# Patient Record
Sex: Male | Born: 1950
Health system: Southern US, Community
[De-identification: ages and names within clinical notes are randomized; demographics above are authoritative.]

## PROBLEM LIST (undated history)

## (undated) DIAGNOSIS — B029 Zoster without complications: Secondary | ICD-10-CM

## (undated) DIAGNOSIS — I10 Essential (primary) hypertension: Secondary | ICD-10-CM

## (undated) DIAGNOSIS — Z8551 Personal history of malignant neoplasm of bladder: Secondary | ICD-10-CM

## (undated) DIAGNOSIS — B269 Mumps without complication: Secondary | ICD-10-CM

## (undated) DIAGNOSIS — B019 Varicella without complication: Secondary | ICD-10-CM

## (undated) DIAGNOSIS — E785 Hyperlipidemia, unspecified: Secondary | ICD-10-CM

## (undated) DIAGNOSIS — K760 Fatty (change of) liver, not elsewhere classified: Secondary | ICD-10-CM

## (undated) DIAGNOSIS — L309 Dermatitis, unspecified: Secondary | ICD-10-CM

## (undated) HISTORY — DX: Mumps without complication: B26.9

## (undated) HISTORY — DX: Essential (primary) hypertension: I10

## (undated) HISTORY — DX: Dermatitis, unspecified: L30.9

## (undated) HISTORY — DX: Personal history of malignant neoplasm of bladder: Z85.51

## (undated) HISTORY — DX: Hyperlipidemia, unspecified: E78.5

## (undated) HISTORY — DX: Varicella without complication: B01.9

## (undated) HISTORY — DX: Fatty (change of) liver, not elsewhere classified: K76.0

## (undated) HISTORY — DX: Zoster without complications: B02.9

## (undated) HISTORY — PX: TONSILLECTOMY: SUR1361

---

## 2000-11-30 ENCOUNTER — Other Ambulatory Visit: Admission: RE | Admit: 2000-11-30 | Discharge: 2000-11-30 | Payer: Self-pay | Admitting: Family Medicine

## 2000-12-05 DIAGNOSIS — Z8551 Personal history of malignant neoplasm of bladder: Secondary | ICD-10-CM

## 2000-12-05 HISTORY — DX: Personal history of malignant neoplasm of bladder: Z85.51

## 2000-12-05 HISTORY — PX: URINARY DIVERSION: SHX2623

## 2000-12-13 ENCOUNTER — Encounter: Payer: Self-pay | Admitting: Urology

## 2000-12-13 ENCOUNTER — Encounter (INDEPENDENT_AMBULATORY_CARE_PROVIDER_SITE_OTHER): Payer: Self-pay | Admitting: Specialist

## 2000-12-13 ENCOUNTER — Encounter: Payer: Self-pay | Admitting: Internal Medicine

## 2000-12-13 ENCOUNTER — Observation Stay (HOSPITAL_COMMUNITY): Admission: RE | Admit: 2000-12-13 | Discharge: 2000-12-14 | Payer: Self-pay | Admitting: Urology

## 2000-12-27 ENCOUNTER — Encounter: Admission: RE | Admit: 2000-12-27 | Discharge: 2000-12-27 | Payer: Self-pay | Admitting: Urology

## 2000-12-27 ENCOUNTER — Encounter: Payer: Self-pay | Admitting: Urology

## 2001-01-16 ENCOUNTER — Inpatient Hospital Stay (HOSPITAL_COMMUNITY): Admission: RE | Admit: 2001-01-16 | Discharge: 2001-01-22 | Payer: Self-pay | Admitting: Urology

## 2001-01-16 ENCOUNTER — Encounter (INDEPENDENT_AMBULATORY_CARE_PROVIDER_SITE_OTHER): Payer: Self-pay | Admitting: Specialist

## 2001-01-16 ENCOUNTER — Encounter: Payer: Self-pay | Admitting: Internal Medicine

## 2001-06-28 ENCOUNTER — Encounter: Admission: RE | Admit: 2001-06-28 | Discharge: 2001-06-28 | Payer: Self-pay | Admitting: Urology

## 2001-06-28 ENCOUNTER — Encounter: Payer: Self-pay | Admitting: Urology

## 2001-11-20 ENCOUNTER — Encounter: Payer: Self-pay | Admitting: Urology

## 2001-11-20 ENCOUNTER — Encounter: Admission: RE | Admit: 2001-11-20 | Discharge: 2001-11-20 | Payer: Self-pay | Admitting: Urology

## 2002-02-26 ENCOUNTER — Encounter: Admission: RE | Admit: 2002-02-26 | Discharge: 2002-02-26 | Payer: Self-pay | Admitting: Urology

## 2002-02-26 ENCOUNTER — Encounter: Payer: Self-pay | Admitting: Urology

## 2002-05-30 ENCOUNTER — Encounter: Payer: Self-pay | Admitting: Urology

## 2002-05-30 ENCOUNTER — Encounter: Admission: RE | Admit: 2002-05-30 | Discharge: 2002-05-30 | Payer: Self-pay | Admitting: Urology

## 2002-10-08 ENCOUNTER — Encounter: Payer: Self-pay | Admitting: Urology

## 2002-10-08 ENCOUNTER — Encounter: Admission: RE | Admit: 2002-10-08 | Discharge: 2002-10-08 | Payer: Self-pay | Admitting: Urology

## 2003-04-08 ENCOUNTER — Encounter: Payer: Self-pay | Admitting: Urology

## 2003-04-08 ENCOUNTER — Encounter: Admission: RE | Admit: 2003-04-08 | Discharge: 2003-04-08 | Payer: Self-pay | Admitting: Urology

## 2003-10-02 ENCOUNTER — Encounter: Admission: RE | Admit: 2003-10-02 | Discharge: 2003-10-02 | Payer: Self-pay | Admitting: Family Medicine

## 2003-11-06 ENCOUNTER — Encounter: Admission: RE | Admit: 2003-11-06 | Discharge: 2003-11-06 | Payer: Self-pay | Admitting: Urology

## 2004-05-27 ENCOUNTER — Encounter: Admission: RE | Admit: 2004-05-27 | Discharge: 2004-05-27 | Payer: Self-pay | Admitting: Urology

## 2004-07-29 ENCOUNTER — Ambulatory Visit (HOSPITAL_COMMUNITY): Admission: RE | Admit: 2004-07-29 | Discharge: 2004-07-29 | Payer: Self-pay | Admitting: Gastroenterology

## 2004-11-10 ENCOUNTER — Ambulatory Visit (HOSPITAL_COMMUNITY): Admission: RE | Admit: 2004-11-10 | Discharge: 2004-11-10 | Payer: Self-pay | Admitting: Urology

## 2004-12-15 ENCOUNTER — Ambulatory Visit (HOSPITAL_COMMUNITY): Admission: RE | Admit: 2004-12-15 | Discharge: 2004-12-15 | Payer: Self-pay | Admitting: Family Medicine

## 2005-07-19 ENCOUNTER — Ambulatory Visit (HOSPITAL_COMMUNITY): Admission: RE | Admit: 2005-07-19 | Discharge: 2005-07-19 | Payer: Self-pay | Admitting: Urology

## 2005-12-05 HISTORY — PX: COLONOSCOPY: SHX174

## 2006-06-08 ENCOUNTER — Ambulatory Visit: Payer: Self-pay | Admitting: Family Medicine

## 2006-09-20 ENCOUNTER — Ambulatory Visit: Payer: Self-pay | Admitting: Internal Medicine

## 2006-09-20 ENCOUNTER — Encounter: Admission: RE | Admit: 2006-09-20 | Discharge: 2006-09-20 | Payer: Self-pay | Admitting: Internal Medicine

## 2007-02-13 ENCOUNTER — Ambulatory Visit: Payer: Self-pay | Admitting: Internal Medicine

## 2007-07-04 ENCOUNTER — Encounter (INDEPENDENT_AMBULATORY_CARE_PROVIDER_SITE_OTHER): Payer: Self-pay | Admitting: *Deleted

## 2007-07-04 ENCOUNTER — Ambulatory Visit: Payer: Self-pay | Admitting: Family Medicine

## 2007-07-04 DIAGNOSIS — M549 Dorsalgia, unspecified: Secondary | ICD-10-CM | POA: Insufficient documentation

## 2007-09-27 ENCOUNTER — Telehealth (INDEPENDENT_AMBULATORY_CARE_PROVIDER_SITE_OTHER): Payer: Self-pay | Admitting: *Deleted

## 2007-09-28 ENCOUNTER — Encounter (INDEPENDENT_AMBULATORY_CARE_PROVIDER_SITE_OTHER): Payer: Self-pay | Admitting: *Deleted

## 2007-09-28 ENCOUNTER — Ambulatory Visit: Payer: Self-pay | Admitting: Family Medicine

## 2007-11-08 ENCOUNTER — Encounter (INDEPENDENT_AMBULATORY_CARE_PROVIDER_SITE_OTHER): Payer: Self-pay | Admitting: Family Medicine

## 2007-11-09 ENCOUNTER — Encounter (INDEPENDENT_AMBULATORY_CARE_PROVIDER_SITE_OTHER): Payer: Self-pay | Admitting: Family Medicine

## 2007-11-27 ENCOUNTER — Telehealth (INDEPENDENT_AMBULATORY_CARE_PROVIDER_SITE_OTHER): Payer: Self-pay | Admitting: *Deleted

## 2007-11-30 ENCOUNTER — Ambulatory Visit: Payer: Self-pay | Admitting: Family Medicine

## 2007-11-30 DIAGNOSIS — E785 Hyperlipidemia, unspecified: Secondary | ICD-10-CM | POA: Insufficient documentation

## 2007-11-30 DIAGNOSIS — I10 Essential (primary) hypertension: Secondary | ICD-10-CM | POA: Insufficient documentation

## 2007-12-03 ENCOUNTER — Encounter (INDEPENDENT_AMBULATORY_CARE_PROVIDER_SITE_OTHER): Payer: Self-pay | Admitting: *Deleted

## 2007-12-03 LAB — CONVERTED CEMR LAB
HDL: 47 mg/dL (ref >39)
LDL Cholesterol: 73 mg/dL (ref 0–99)
Total CHOL/HDL Ratio: 3.9
Triglycerides: 327 mg/dL — ABNORMAL HIGH (ref <150)
VLDL: 65 mg/dL — ABNORMAL HIGH (ref 0–40)

## 2008-02-20 ENCOUNTER — Telehealth (INDEPENDENT_AMBULATORY_CARE_PROVIDER_SITE_OTHER): Payer: Self-pay | Admitting: *Deleted

## 2008-07-18 ENCOUNTER — Ambulatory Visit: Payer: Self-pay | Admitting: Internal Medicine

## 2008-07-18 DIAGNOSIS — Z8551 Personal history of malignant neoplasm of bladder: Secondary | ICD-10-CM | POA: Insufficient documentation

## 2008-07-18 LAB — CONVERTED CEMR LAB
BUN: 20 mg/dL (ref 6–23)
Basophils Absolute: 0.1 10*3/uL (ref 0.0–0.1)
Basophils Relative: 1 % (ref 0.0–3.0)
CRP, High Sensitivity: <1 — ABNORMAL LOW (ref 0.00–5.00)
Calcium: 9.2 mg/dL (ref 8.4–10.5)
Chloride: 104 meq/L (ref 96–112)
Cholesterol: 164 mg/dL (ref 0–200)
Creatinine, Ser: 1 mg/dL (ref 0.4–1.5)
Eosinophils Absolute: 0.3 10*3/uL (ref 0.0–0.7)
Eosinophils Relative: 4.5 % (ref 0.0–5.0)
GFR calc non Af Amer: 82 mL/min
HCT: 47.9 % (ref 39.0–52.0)
Hemoglobin: 16.6 g/dL (ref 13.0–17.0)
MCHC: 34.6 g/dL (ref 30.0–36.0)
MCV: 89.9 fL (ref 78.0–100.0)
Neutro Abs: 3.2 10*3/uL (ref 1.4–7.7)
RBC: 5.33 M/uL (ref 4.22–5.81)
VLDL: 32 mg/dL (ref 0–40)
WBC: 6.1 10*3/uL (ref 4.5–10.5)

## 2008-07-20 ENCOUNTER — Encounter: Payer: Self-pay | Admitting: Internal Medicine

## 2009-04-03 ENCOUNTER — Encounter: Payer: Self-pay | Admitting: Internal Medicine

## 2009-08-03 ENCOUNTER — Telehealth: Payer: Self-pay | Admitting: Internal Medicine

## 2009-08-24 ENCOUNTER — Telehealth: Payer: Self-pay | Admitting: Internal Medicine

## 2009-08-28 ENCOUNTER — Ambulatory Visit: Payer: Self-pay | Admitting: Internal Medicine

## 2009-08-28 DIAGNOSIS — N529 Male erectile dysfunction, unspecified: Secondary | ICD-10-CM | POA: Insufficient documentation

## 2009-08-28 LAB — CONVERTED CEMR LAB
ALT: 28 units/L (ref 0–53)
AST: 24 units/L (ref 0–37)
Albumin: 4.6 g/dL (ref 3.5–5.2)
BUN: 17 mg/dL (ref 6–23)
Chloride: 102 meq/L (ref 96–112)
Cholesterol: 178 mg/dL (ref 0–200)
Creatinine, Ser: 1 mg/dL (ref 0.40–1.50)
Glucose, Bld: 97 mg/dL (ref 70–99)
Potassium: 4.4 meq/L (ref 3.5–5.3)
TSH: 1.561 microintl units/mL (ref 0.350–4.500)
Total Bilirubin: 0.9 mg/dL (ref 0.3–1.2)
Triglycerides: 158 mg/dL — ABNORMAL HIGH (ref <150)

## 2009-08-31 ENCOUNTER — Encounter: Payer: Self-pay | Admitting: Internal Medicine

## 2009-12-03 ENCOUNTER — Telehealth (INDEPENDENT_AMBULATORY_CARE_PROVIDER_SITE_OTHER): Payer: Self-pay | Admitting: *Deleted

## 2009-12-21 ENCOUNTER — Telehealth: Payer: Self-pay | Admitting: Internal Medicine

## 2010-02-19 ENCOUNTER — Ambulatory Visit: Payer: Self-pay | Admitting: Internal Medicine

## 2011-01-04 NOTE — Progress Notes (Signed)
Summary: Lipitor Refill  Phone Note Refill Request Message from:  Fax from Pharmacy  Refills Requested: Medication #1:  LIPITOR 20 MG  TABS Take one tablet daily   Dosage confirmed as above?Dosage Confirmed   Supply Requested: 3 months refill request from [facility] in [place]   Initial call taken by: Michaelle Copas,  December 21, 2009 10:04 AM  Follow-up for Phone Call        Rx completed in Dr. Tiajuana Amass Follow-up by: Glendell Docker CMA,  December 21, 2009 5:18 PM    Prescriptions: LIPITOR 20 MG  TABS (ATORVASTATIN CALCIUM) Take one tablet daily  #90 x 0   Entered by:   Glendell Docker CMA   Authorized by:   D. Thomos Lemons DO   Signed by:   Glendell Docker CMA on 12/21/2009   Method used:   Electronically to        [facility] [address] (retail)       [address] [place], [place]  [address]       Ph: [phone] or [phone]       Fax: [phone]   RxID:   [id number]

## 2011-01-04 NOTE — Assessment & Plan Note (Signed)
Summary: fasting cpx- jr   Vital Signs:  Patient profile:   60 year old male Height:      73 inches Weight:      257 pounds BMI:     34.03 O2 Sat:      98 % on Room air Temp:     98.1 degrees F oral Pulse rate:   86 / minute Pulse rhythm:   regular Resp:     20 per minute BP sitting:   122 / 82  (right arm) Cuff size:   large  Vitals Entered By: Glendell Docker CMA (February 19, 2010 2:35 PM)  O2 Flow:  Room air CC: Rm 3- CPX Comments discuss changing Lipitor, cancer folow up on xrays,  fasting for blood work   Primary Care Provider:  D. Thomos Lemons DO  CC:  Rm 3- CPX.  History of Present Illness: 60 y/o white male with hx of htn, hyperlipidemia and bladder cancer for routine cpx overall doing well working in [employer] still.  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  Review of Systems       The patient complains of weight gain.  The patient denies fever, chest pain, syncope, dyspnea on exertion, prolonged cough, abdominal pain, melena, hematochezia, and severe indigestion/heartburn.    Physical Exam  General:  alert, well-developed, and  well-nourished.   Head:  normocephalic and atraumatic.   Ears:  R ear normal and L ear normal.   Mouth:  Oral mucosa and oropharynx without lesions or exudates.   Neck:  supple, no masses, and no carotid bruits.   Lungs:  normal respiratory effort, normal breath sounds, no crackles, and no wheezes.   Heart:  normal rate, regular rhythm, no murmur, and no gallop.   Abdomen:  soft, non-tender, and normal bowel sounds.   Rectal:  no external abnormalities, normal sphincter tone, and no masses.   Extremities:  No lower extremity edema  Neurologic:  cranial nerves II-XII intact and gait normal.     Impression & Recommendations:  Problem # 1:  HEALTH MAINTENANCE EXAM (ICD-V70.0) Reviewed adult health maintenance protocols.  Colonoscopy: normal (04/24/2006) Td Booster: given (05/22/2006)   Flu Vax: Fluvax Non-MCR (08/28/2009)   Chol: 178 (08/28/2009)   HDL: 50 (08/28/2009)   LDL: 96 (08/28/2009)   TG: 158 (08/28/2009) TSH: 1.561 (08/28/2009)     Problem # 2:  HYPERTENSION (ICD-401.9) well controlled.  I encouraged wt loss His updated medication list for this problem includes:    Lisinopril-hydrochlorothiazide 20-25 Mg Tabs (Lisinopril-hydrochlorothiazide) .Marland Kitchen... Take one tablet by mouth daily  Future Orders:  T-Basic Metabolic Panel [id number]) ... 04/21/2010  BP today: 122/82 Prior BP: 110/74 (08/28/2009)  Labs Reviewed: K+: 4.4 (08/28/2009) Creat: : 1.00 (08/28/2009)   Chol: 178 (08/28/2009)   HDL: 50 (08/28/2009)   LDL: 96 (08/28/2009)   TG: 158 (08/28/2009)  Problem # 3:  DYSLIPIDEMIA (ICD-272.4) lipitor cost prohibitive.  change to simvastatin.  His updated medication list for this problem includes:    Simvastatin 40 Mg Tabs (Simvastatin) ..... One by mouth q pm  Future Orders: T-Hepatic Function [id number]) ... 04/21/2010 T-Lipid Profile [id number]) ... 04/21/2010 T-TSH [id number]) ... 04/21/2010  Complete Medication List: 1)  Lisinopril-hydrochlorothiazide  20-25 Mg Tabs (Lisinopril-hydrochlorothiazide) .... Take one tablet by mouth daily 2)  Aspirin Low Strength 81 Mg Chew (Aspirin) .... Take 1 tablet by mouth once a day 3)  Simvastatin 40 Mg Tabs (Simvastatin) .... One by mouth q pm 4)  Co Q10 100 Mg Tabs (Coenzyme q10) .... Take 1 tablet by mouth once a day 5)  Vitamin D3 1000 Unit Tabs (Cholecalciferol) .... Take 1 tablet by mouth once a day 6)  Cialis 20 Mg Tabs (Tadalafil) .... Take one as directed 7)  Zostavax 38182 Unt/0.4ml Solr (Zoster vaccine live) .... Administer vaccine x 1  Patient Instructions: 1)  Please schedule a follow-up appointment in 1 year. 2)  BMP prior to visit, ICD-9:  401.9 3)  Hepatic Panel prior to visit, ICD-9: 272.4 4)  Lipid Panel prior to visit, ICD-9:  272.4 5)  TSH prior to visit, ICD-9:  272.4 6)  Please return for lab work in 2 months Prescriptions: ZOSTAVAX 99371 UNT/0.65ML SOLR (ZOSTER VACCINE LIVE) administer vaccine x 1  #1 x 0   Entered and Authorized by:   D. Thomos Lemons DO   Signed by:   D. Thomos Lemons DO on 02/19/2010   Method used:   Print then Mail to Patient   RxID:   [id number] LISINOPRIL-HYDROCHLOROTHIAZIDE 20-25 MG  TABS (LISINOPRIL-HYDROCHLOROTHIAZIDE) take one tablet by mouth daily  #90 x 3   Entered and Authorized by:   D. Thomos Lemons DO   Signed by:   D. Thomos Lemons DO on 02/19/2010   Method used:   Electronically to        [facility] [address] (retail)       [address] [place], [place]  [address]       Ph: [phone] or [phone]       Fax: [phone]   RxID:   [id number] SIMVASTATIN 40 MG TABS (SIMVASTATIN) one by mouth q pm  #90 x 3   Entered and Authorized by:   D. Thomos Lemons DO   Signed by:   D. Thomos Lemons DO on 02/19/2010   Method used:   Electronically to        [facility] [address] (retail)       [address] [place], [place]  [address]       Ph: [phone] or [phone]       Fax: [phone]    RxID:   [id number]

## 2011-01-17 ENCOUNTER — Telehealth: Payer: Self-pay | Admitting: Internal Medicine

## 2011-01-20 ENCOUNTER — Encounter: Payer: Self-pay | Admitting: Internal Medicine

## 2011-01-20 LAB — CONVERTED CEMR LAB
ALT: 23 units/L (ref 0–53)
AST: 17 units/L (ref 0–37)
Albumin: 4.4 g/dL (ref 3.5–5.2)
Alkaline Phosphatase: 42 units/L (ref 39–117)
Calcium: 9 mg/dL (ref 8.4–10.5)
Chloride: 104 meq/L (ref 96–112)
Cholesterol: 172 mg/dL (ref 0–200)
Creatinine, Ser: 0.93 mg/dL (ref 0.40–1.50)
HDL: 51 mg/dL (ref >39)
Total Protein: 6.4 g/dL (ref 6.0–8.3)
Triglycerides: 147 mg/dL (ref <150)

## 2011-01-24 ENCOUNTER — Encounter: Payer: Self-pay | Admitting: Internal Medicine

## 2011-01-24 ENCOUNTER — Encounter (INDEPENDENT_AMBULATORY_CARE_PROVIDER_SITE_OTHER): Payer: Commercial Indemnity | Admitting: Internal Medicine

## 2011-01-24 ENCOUNTER — Ambulatory Visit: Payer: Self-pay | Admitting: Internal Medicine

## 2011-01-24 DIAGNOSIS — J329 Chronic sinusitis, unspecified: Secondary | ICD-10-CM | POA: Insufficient documentation

## 2011-01-24 DIAGNOSIS — R7309 Other abnormal glucose: Secondary | ICD-10-CM

## 2011-01-24 DIAGNOSIS — I10 Essential (primary) hypertension: Secondary | ICD-10-CM

## 2011-01-24 DIAGNOSIS — E785 Hyperlipidemia, unspecified: Secondary | ICD-10-CM

## 2011-01-26 NOTE — Progress Notes (Signed)
Summary: Lisinopril Refill  Phone Note Refill Request Message from:  Fax from Pharmacy on January 17, 2011 9:01 AM  Refills Requested: Medication #1:  LISINOPRIL-HYDROCHLOROTHIAZIDE 20-25 MG  TABS take one tablet by mouth daily   Dosage confirmed as above?Dosage Confirmed   Brand Name Necessary? No   Supply Requested: 3 months   Last Refilled: 10/03/2010 [facility] [address] [place], [place] [address] fax [phone].   Method Requested: Electronic Next Appointment Scheduled: none Initial call taken by: Elba Barman,  January 17, 2011 9:02 AM  Follow-up for Phone Call        refill #30 tabs only.  he needs OV and labs before addt'l refills Follow-up by: D. Thomos Lemons DO,  January 17, 2011 1:06 PM  Additional Follow-up for Phone Call Additional follow up Details #1::        call placed to patient at [phone], he has been advised per Dr Artist Pais instructions. Patient will call back with the location of [facility] lab in [place] for blood work Additional Follow-up by: Glendell Docker CMA,  January 17, 2011 1:27 PM    Additional Follow-up for Phone Call Additional follow up Details #2::    pt returned phone call and made appt for 2.20.12 at 3:15 for follow up. Also, he gave name of lab he will be using. He wants to go in tomorrow morning to lab for bloodwork??   [facility] [address] [place] [address] phone# [phone] fax# [phone]  pt phone# [phone] if you need to call him back.  Follow-up by: Elba Barman,  January 17, 2011 1:50 PM  Additional Follow-up for Phone Call Additional follow up Details #3:: Details for Additional Follow-up Action Taken: rx refilled for qty 30 only faxed to [place] [facility].  Patient is wanting labs done on Tuesday 01/18/2011 in [place] Daniel CMA  January 17, 2011 4:22 PM   BMP prior to visit, ICD-9:  401.9 Hepatic Panel prior to visit, ICD-9:  272.4 Lipid Panel prior to visit,  ICD-9:  272.4 TSH prior to visit, ICD-9: 272.4 PSA- v76.44 D. Thomos Lemons DO  January 17, 2011 5:03 PM   Prescriptions: LISINOPRIL-HYDROCHLOROTHIAZIDE 20-25 MG  TABS (LISINOPRIL-HYDROCHLOROTHIAZIDE) take one tablet by mouth daily  #30 x 0   Entered by:   Glendell Docker CMA   Authorized by:   D. Thomos Lemons DO   Signed by:   Glendell Docker CMA on 01/17/2011   Method used:   Faxed to ...       [facility] (retail)       [address]       [place], [place]  [address]       Ph: [phone] or [phone]       Fax: [phone]   RxID:   [id number] LISINOPRIL-HYDROCHLOROTHIAZIDE 20-25 MG  TABS (LISINOPRIL-HYDROCHLOROTHIAZIDE) take one tablet by mouth daily  #30 x 3   Entered by:   Glendell Docker CMA   Authorized by:   D. Thomos Lemons DO   Signed by:   Glendell Docker CMA on 01/17/2011   Method used:   Print then Give to Patient   RxID:   [id number]  Order faxed to [facility] in [place] at [phone] Glendell Docker CMA  January 17, 2011 5:22 PM

## 2011-02-15 NOTE — Assessment & Plan Note (Signed)
Summary: ov/med refill/ss   Vital Signs:  Patient profile:   60 year old male Height:      73 inches Weight:      254.25 pounds BMI:     33.67 O2 Sat:      99 % on Room air Temp:     98.3 degrees F oral Pulse rate:   82 / minute Resp:     18 per minute BP sitting:   126 / 70  (right arm) Cuff size:   large  Vitals Entered By: Glendell Docker CMA (January 24, 2011 2:39 PM)  O2 Flow:  Room air CC: Medication Refills Is Patient Diabetic? No Pain Assessment Patient in pain? no        Primary Care Provider:  Dondra Spry DO  CC:  Medication Refills.  History of Present Illness: 60 y/o c/o sinus headaches, stiffness,soreness, and nasal congestion, and discharge yellow in color, and productive cough green in color. He states that he was treated at [facility] and given a rx for Amoxicillin    htn - stable hyperlipidemia - stable  Preventive Screening-Counseling & Management  Alcohol-Tobacco     Smoking Status: never  Allergies (verified): No Known Drug Allergies  Past History:  Past Medical History: Hypertension Hyperlipidemia   Hx of gross hematuria secondary to invasive bladder cancer 2002.   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  Physical Exam  General:  alert, well-developed, and well-nourished.   Head:  normocephalic and atraumatic.   Ears:  R ear normal and L ear normal.   Nose:  mucosal erythema and mucosal edema.   Mouth:  pharyngeal  erythema.   Lungs:  normal respiratory effort, normal breath sounds, no crackles, and no wheezes.   Heart:  normal rate, regular rhythm, no murmur, and no gallop.   Extremities:  No lower extremity edema   Impression & Recommendations:  Problem # 1:  SINUSITIS (ICD-473.9) Assessment New  His updated medication list for this problem includes:    Cefuroxime Axetil 500 Mg Tabs (Cefuroxime axetil) ..... One by mouth two times a day  Take antibiotics for full duration. Discussed treatment options including indications for coronal CT scan of sinuses and ENT referral.   Problem # 2:  HYPERTENSION (ICD-401.9) Assessment: Unchanged  His updated medication list for this problem includes:    Lisinopril-hydrochlorothiazide 20-25 Mg Tabs (Lisinopril-hydrochlorothiazide) .Marland Kitchen... Take one tablet by mouth daily  BP today: 126/70 Prior BP: 122/82 (02/19/2010)  Labs Reviewed: K+: 4.4 (01/20/2011) Creat: : 0.93 (01/20/2011)   Chol: 172 (01/20/2011)   HDL: 51 (01/20/2011)   LDL: 92 (01/20/2011)   TG: 147 (01/20/2011)  Problem # 3:  DYSLIPIDEMIA (ICD-272.4) Assessment: Unchanged  His updated medication list for this problem includes:    Simvastatin 40 Mg Tabs (Simvastatin) ..... One by mouth q pm  Labs Reviewed: SGOT: 17 (01/20/2011)   SGPT: 23 (01/20/2011)  HDL:51 (01/20/2011), 50 (08/28/2009)  LDL:92 (01/20/2011), 96 (08/28/2009)  Chol:172 (01/20/2011), 178 (08/28/2009)  Trig:147 (01/20/2011), 158 (08/28/2009)  Problem # 4:  HYPERGLYCEMIA (ICD-790.29)  Pt counseled on diet and exercise.  Labs Reviewed: Creat: 0.93 (01/20/2011)     Complete Medication List: 1)  Lisinopril-hydrochlorothiazide 20-25 Mg Tabs (Lisinopril-hydrochlorothiazide) .... Take one tablet by mouth daily 2)  Aspirin Low Strength 81 Mg Chew (Aspirin) .... Take 1 tablet by mouth once a day 3)  Simvastatin 40 Mg Tabs (Simvastatin) .... One by mouth q pm 4)  Co Q10 100 Mg Tabs (Coenzyme q10) .... Take 1 tablet by mouth  once a day 5)  Vitamin D3 1000 Unit Tabs (Cholecalciferol) .... Take 1 tablet by mouth once a day 6)  Cialis 20 Mg Tabs (Tadalafil) .... Take one as directed 7)  Zostavax 16109 Unt/0.85ml Solr (Zoster vaccine live) .... Administer vaccine x 1 8)  Cefuroxime Axetil 500 Mg Tabs (Cefuroxime axetil) .... One by mouth two times a day  Patient Instructions: 1)  Please schedule a follow-up appointment in 6 months. 2)  BMP prior to visit, ICD-9:  401.9 3)  HbgA1C prior to visit, ICD-9: 790.29 4)  Please return for lab work one (1) week before your next appointment.  Prescriptions: SIMVASTATIN 40 MG TABS (SIMVASTATIN) one by mouth q pm  #90 x 3   Entered and Authorized by:   D. Thomos Lemons DO   Signed by:   D. Thomos Lemons DO on 01/24/2011   Method used:   Print then Give to Patient   RxID:   [id number] LISINOPRIL-HYDROCHLOROTHIAZIDE 20-25 MG  TABS (LISINOPRIL-HYDROCHLOROTHIAZIDE) take one tablet by mouth daily  #90 x 3   Entered and Authorized by:   D. Thomos Lemons DO   Signed by:   D. Thomos Lemons DO on 01/24/2011   Method used:   Print then Give to Patient   RxID:   [id number] CEFUROXIME AXETIL 500 MG TABS (CEFUROXIME AXETIL) one by mouth two times a day  #20 x 0   Entered and Authorized by:   D. Thomos Lemons DO   Signed by:   D. Thomos Lemons DO on 01/24/2011   Method used:   Print then Give to Patient   RxID:   [id number]    Orders Added: 1)  Est. Patient Level III [25366]    Current Allergies (reviewed today): No known allergies

## 2011-03-24 ENCOUNTER — Encounter: Payer: Self-pay | Admitting: Internal Medicine

## 2011-03-25 ENCOUNTER — Ambulatory Visit (INDEPENDENT_AMBULATORY_CARE_PROVIDER_SITE_OTHER): Payer: PRIVATE HEALTH INSURANCE | Admitting: Internal Medicine

## 2011-03-25 ENCOUNTER — Ambulatory Visit (HOSPITAL_COMMUNITY)
Admission: RE | Admit: 2011-03-25 | Discharge: 2011-03-25 | Disposition: A | Discharge status: Home / Self Care | Payer: PRIVATE HEALTH INSURANCE | Source: Ambulatory Visit | Attending: Urology | Admitting: Urology

## 2011-03-25 ENCOUNTER — Encounter: Payer: Self-pay | Admitting: Internal Medicine

## 2011-03-25 ENCOUNTER — Other Ambulatory Visit: Payer: Self-pay | Admitting: Urology

## 2011-03-25 VITALS — BP 116/80 | HR 78 | Temp 98.1°F | Resp 18 | Ht 73.0 in | Wt 251.0 lb

## 2011-03-25 DIAGNOSIS — I10 Essential (primary) hypertension: Secondary | ICD-10-CM | POA: Insufficient documentation

## 2011-03-25 DIAGNOSIS — R7309 Other abnormal glucose: Secondary | ICD-10-CM

## 2011-03-25 DIAGNOSIS — IMO0001 Reserved for inherently not codable concepts without codable children: Secondary | ICD-10-CM

## 2011-03-25 DIAGNOSIS — M791 Myalgia, unspecified site: Secondary | ICD-10-CM | POA: Insufficient documentation

## 2011-03-25 DIAGNOSIS — C679 Malignant neoplasm of bladder, unspecified: Secondary | ICD-10-CM | POA: Insufficient documentation

## 2011-03-25 LAB — HEMOGLOBIN A1C: Mean Plasma Glucose: 123 mg/dL — ABNORMAL HIGH (ref <117)

## 2011-03-25 NOTE — Progress Notes (Signed)
Subjective:    Patient ID: Gregory Tate, male    DOB: 01-09-1951, 60 y.o.   MRN: [id number]  HPI 60 y/o white male for follow up.  Pt experiencing bilateral leg pains. Focal area of numbness left outer upper thigh.More fatigue than usual. He denies exercise intolerance.  Pt able to climb stairs w/o any issues.  Hx of bladder ca - recent surveillance CT of chest reported normal   Review of Systems  Denies cough,  Negative for shortness of breath    Past Medical History  Diagnosis Date  . Hypertension   . Hyperlipidemia   . 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 Fatty liver     mild    History   Social History  . Marital Status: Married    Spouse Name: N/A    Number of Children: N/A  . Years of Education: N/A   Occupational History  . Not on file.   Social History Main Topics  . Smoking status: Never Smoker   . Smokeless tobacco: Not on file  . Alcohol Use: Not on file  . Drug Use: Not on file  . Sexually Active: Not on file   Other Topics Concern  . 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   Hotel companyMarried for 32 year one son 80 New grandchildAlcohol use-yes (social)  < 14 drinks per week.    Past Surgical History  Procedure Date  . Colonoscopy 2007  . Urinary diversion 2002    radical cystoprostatecomy and urinary diversion     Family History  Problem Relation Age of Onset  . Coronary artery disease Father   . Hypertension Mother   . Hypertension Father   . Diabetes Brother     borderline    No Known Allergies  Current Outpatient Prescriptions on File Prior to Visit  Medication Sig Dispense Refill  . aspirin 81 MG tablet Take 81 mg by mouth daily.        . Cholecalciferol (VITAMIN D3) 1000 UNITS tablet Take 1,000 Units by mouth daily.         . Coenzyme Q10 (CO Q 10) 100 MG CAPS Take 100 mg by mouth daily.        Marland Kitchen lisinopril-hydrochlorothiazide (PRINZIDE,ZESTORETIC) 20-25 MG per tablet Take 1 tablet by mouth daily.        . simvastatin (ZOCOR) 40 MG tablet Take 40 mg by mouth at bedtime.        . tadalafil (CIALIS) 20 MG tablet Take 20 mg by mouth daily as needed.          BP 116/80  Pulse 78  Temp(Src) 98.1 F (36.7 C) (Oral)  Resp 18  Ht 6\' 1"  (1.854 m)  Wt 251 lb (113.853 kg)  BMI 33.12 kg/m2    Objective:   Physical Exam  Constitutional: He appears well-developed and well-nourished.  HENT:  Head: Normocephalic.  Cardiovascular: Normal rate, regular rhythm and normal heart sounds.   Pulmonary/Chest: Effort normal and breath sounds normal. He has no wheezes. He has no rales.  Musculoskeletal: He exhibits no edema.  Skin: Skin is warm and dry.  Psychiatric: He has a normal mood and affect. His behavior is normal.         Assessment &  Plan:

## 2011-03-25 NOTE — Patient Instructions (Addendum)
Perform hamstring stretching exercises as directed. Our office will contact you re: blood test results. Hold simvastatin x 2 weeks. If your leg symptoms do not improve, please restart simvastatin.

## 2011-04-11 NOTE — Assessment & Plan Note (Addendum)
Leg pains of unclear etiology.  Trial of holding statin x 2 wks to see if symptoms improve. Question lumbar disc disease contributing to his symptoms. Check sed rate and CPK

## 2011-04-11 NOTE — Assessment & Plan Note (Signed)
Screen for diabetes.  Check A1c.  Encouraged healthy lifestyle

## 2011-04-22 NOTE — Op Note (Signed)
[facility]  Patient:    Gregory Tate, Gregory Tate                       MRN: [id number] Proc. Date: 12/13/00 Adm. Date:  [id number] Attending:  Lauree Chandler CC:         Lilyan Punt. Sydnee Levans, M.D.   Operative Report  PREOPERATIVE DIAGNOSIS:  Bladder carcinoma.  POSTOPERATIVE DIAGNOSIS:  Bladder carcinoma.  PROCEDURE:  Cystoscopy, bilateral retrograde pyelogram with interpretation and transurethral resection of large bladder tumor.  SURGEON:  Dr. Larey Dresser.  ANESTHESIA:  General.  INDICATIONS FOR PROCEDURE:  This 60 year old gentleman developed gross hematuria and I saw him in the office and a renal ultrasound was unremarkable but cystoscopy revealed an obvious papillary bladder carcinoma overlying the course of the left intramural ureter. He is brought to the operating room for further evaluation of his upper tracts and resection of this lesion.  DESCRIPTION OF PROCEDURE:  The patient was brought to the operating room and placed in lithotomy position after the induction of satisfactory general anesthesia. He cystoscoped the anterior urethra and the prostatic urethra was unremarkable. Overlying the course of the left intramural ureter and extending both toward the dome and left lateral wall of the bladder was a large papillary tumor where the rigid cystoscope appears much larger than I first thought it was on the flexible scope. It measures at least 6 cm in diameter. The ureteral orifice is just at the lower bottom edge of the tumor. I then used the 5 [place] whistle tip ureteral catheter and performed a right retrograde pyelogram under fluoroscopic control and saved permanent images which would be started in my office and showed a normal upper tract with no obstruction or masses or filling defect. Likewise in the left side, the upper tract was perfectly normal. I left the 5 French whistle tip ureteral catheter in place and then removed the  cystoscope and inserted a 24 French resectoscope sheath. I resected this tumor in its entirety and as I said before started just at the edge of the ureteral orifice and extended over the intramural ureter and went toward the dome and up on the left lateral wall. It was worrisome and it was broad based and the base of it seemed fairly substantial. I am concerned about invasive disease. I sent three separate pieces from the base of the tumor to look for muscular invasion. At this point, there was no evidence of any further papillary tumor. The whole bladder biopsy site and the edges were fulgurated with the coagulating current. There was very little blood loss. There was good hemostasis at the end of the case. Because of this large tumor, I felt a Foley catheter should be left in place. I put in 22 French 5 cc Foley with 10 cc in the balloon and connected closed drainage with clear irrigation. The patient was taken to the recovery room in good condition having tolerated the procedure well. DD:  12/13/00 TD:  12/13/00 Job: [id number]

## 2011-04-22 NOTE — Consult Note (Signed)
[facility]  Patient:    Gregory Tate, Gregory Tate                       MRN: [id number] Proc. Date: 01/19/01 Adm. Date:  [id number] Attending:  Lauree Chandler CC:         Maretta Bees. Vonita Moss, M.D.  Lilyan Punt Sydnee Levans, M.D.   Consultation Report  CONSULTING PHYSICIAN:  Dr. Maretta Bees. Vonita Moss.  REASON FOR CONSULTATION:  Evaluation and management in the setting of bladder carcinoma.  HISTORY OF PRESENT ILLNESS:  Gregory Tate is a 60 year old [place] man who approximately late October of 2001 noticed some gross hematuria ("spotting"). He was already scheduled for a physical exam under Dr. Caryn Bee C. [place], and after that evaluation, he was referred to Dr. Larey Dresser.  In early January, Dr. Vonita Moss performed a renal ultrasound which showed no masses, stones or hydronephrosis.  He did a cystoscopy then and this showed partial obstruction of the prostatic urethra and then overlying the course of the left intramural ureter, an obvious papillary bladder carcinoma measuring at least 2 to 3 cm in size.  Surgical options were then discussed with the patient and on December 13, 2000, the patient underwent TURP.  This showed ([id number]) a high-grade invasive papillary transitional cell carcinoma involving the bladder wall smooth muscle.  With this information and after more discussion, including discussion with Dr. Bertram Millard. Dahlstedt, the patient proceeded to radical cystoprostatectomy with bilateral lymph node dissection and continent urinary diversion on January 16, 2001.  The final pathology ([id number]) showed a high-grade invasive and transitional cell carcinoma extending through the muscularis in through the perivesical connective tissue.  There were four right pelvic and one left pelvic lymph node sampled, all negative.  The right distal ureter and the left distal ureter showed no evidence of malignancy.  Margins were negative and sectioning of the  prostate showed only a benign prostatic tissue.  With this information, the patients tumor stages as T3a, N0, or stage III.  PAST MEDICAL HISTORY 1. Hypercholesterolemia. 2. Hypertension. 3. History of tobacco abuse.  FAMILY HISTORY:  The patients father is alive at age 98 with a history of myocardial infarction x 2; patients mother is alive at 76 with a history of hypertension.  The patient has two brothers.  There is no history of cancer in the immediate family.  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  His wife, Stark Bray, of 25 years, works for the [employer].  They have a son, Tawanna Cooler, 55, who is at [employer].  The Lotiches are Presbyterians.  HEALTH MAINTENANCE:  The patient has a history of high cholesterol.  He had a recent exercise stress test which was very favorable.  He is planning to go on cholesterol treatment after "all this is over."  He used to smoke up to a half pack per day but quit in 1980.  There is no history of ETOH abuse, although he does drink occasionally.  He was scheduled for colonoscopy previously but has never had one.  He has never had a flu shot or Pneumovax and has never had a blood transfusion.  He has a living will in place and he has named his wife Stark Bray as his health care power of attorney.  ALLERGIES:  No known drug allergies.  MEDICATIONS:  Prior to this admission, he was on his medications.  REVIEW OF SYSTEMS:  He is recovering nicely from  the surgery.  He gets a "hot feeling" when he takes his pain medications (he is still on the PCA).  He finally had a bowel movement today.  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  He has had no history of seizures, blackouts, visual changes, nausea and vomiting, ulcers, difficulty swallowing, reflux, cough, phlegm production, pleurisy, shortness of breath, changes in bowel or bladder habits or any fever, rash or  bleeding problems.  PHYSICAL EXAMINATION  GENERAL:  He is a middle-aged white male with a temperature of 97.9 degrees, heart rate 92, respiratory rate 12, blood pressure 178/78, and the room air saturation has been in the 100% range.  HEENT:  Pupils are equal, round and reactive.  Oropharynx clear.  NECK:  Supple.  LYMPHATICS:  I do not detect any cervical, supraclavicular or axillary adenopathy.  LUNGS:  I do not hear any crackles or wheezes over the lung fields.  HEART:  Regular rate and rhythm.  ABDOMEN:  Obese but otherwise benign.  Bowel sounds are positive.  The abdominal incision is healing nicely with staples still in place.  The continent ileostomy is draining nicely with slightly pinkish urine present in the tubes.  EXTREMITIES:  The patient has compression stockings in place.  NEUROLOGIC:  Exam is nonfocal.  LABORATORY DATA:  Labs, this admission, showed a WBC count of 11.3, hemoglobin 12.1, MCV 82.2, platelets 194,000, creatinine 1.2, BUN 16, glucose 173 and calcium 8.1.  FILMS:  The patient had CT of the abdomen and pelvis on January 23rd as well as a chest x-ray, all this at [facility]; there is no evidence of metastatic disease at present.  IMPRESSION AND PLAN:  [age]-year-old [place] man, status post radical cystoprostatectomy and bilateral pelvic lymphadenectomy with continent urinary diversion, January 16, 2001, for a T3a, N0, M0 transitional cell carcinoma bladder carcinoma, grade 3/3.  We had a long talk today, more of an orientation session, regarding the nature of bladder cancer and its staging.  He understands that there is some risk of there being microscopic residual disease but all margins were negative and if another surgeon went to explore him at present, they would find no cancer. Adjuvant treatment in patients like him is in transition, with some  authorities suggesting that a close followup strategy is best, with aggressive treatment  when cancer can be detected; and this obviously spares patients who are cured the need for chemotherapy; other authorities, however, suggesting that chemotherapy is most effective when the cancer is microscopic and therefore the chance of cure is greatly improved if the patient is treated at this point.  Because of this uncertainty, it may be that Mr. Bells best bet will be to enter a randomized phase 3 trial and we will be discussing all these options in detail when he returns to see me in the office in two to three weeks.  He will be calling today to make that appointment.  Note that this patient does have a living will in place and his wife is his health care power of attorney. DD:  01/19/01 TD:  01/20/01 Job: [id number]

## 2011-04-22 NOTE — Op Note (Signed)
NAME:  SKIP, LITKE                          ACCOUNT NO.:  [id number]   MEDICAL RECORD NO.:  [id number]                   PATIENT TYPE:  AMB   LOCATION:  ENDO                                 FACILITY:  [facility]   PHYSICIAN:  Graylin Shiver, M.D.                DATE OF BIRTH:  Aug 14, 1951   DATE OF PROCEDURE:  07/29/2004  DATE OF DISCHARGE:                                 OPERATIVE REPORT   PROCEDURE:  Colonoscopy.   INDICATIONS FOR PROCEDURE:  Screening.  Informed consent was obtained after  explanation of the risks of bleeding, infection, and perforation.   PREMEDICATION:  Fentanyl 75 mcg IV, Versed 6 mg IV.   PROCEDURE IN DETAIL:  With the patient in the left lateral decubitus  position, a rectal exam was performed and no masses were felt.  The Olympus  colonoscope was inserted into the rectum and advanced around the colon to  the cecum.  Cecal landmarks were identified.  The cecum and ascending colon  were normal.  The transverse colon was normal.  The descending colon,  sigmoid and rectum were normal.  He tolerated the procedure well without  complications.   IMPRESSION:  Normal colonoscopy to the cecum.   RECOMMENDATIONS:  I would recommend a follow up screening colonoscopy again  in ten years.                                               Graylin Shiver, M.D.    Germain Osgood  D:  07/29/2004  T:  07/29/2004  Job:  [id number]   cc:   Leanne Chang, M.D.  [address]  [place]  [place] [address]  Fax: [phone]

## 2011-04-22 NOTE — H&P (Signed)
[facility]  Patient:    Gregory Tate, Gregory Tate                       MRN: [id number] Adm. Date:  [id number] Attending:  Lauree Chandler CC:         Lilyan Punt. Sydnee Levans, M.D. at [facility]  Bertram Millard. Dahlstedt, M.D.   History and Physical  HISTORY OF PRESENT ILLNESS:  This 60 year old gentleman was seen by me in early January of this year having had a history of gross hematuria in December. He had had clots and significant bleeding. Renal ultrasound showed normal upper tract. However, cystoscopy revealed an obvious papillary carcinoma overlying the course of the left intramural ureter. He underwent TUR of this large bladder tumor that returned as being high-grade malignancy invading the muscular wall. He and his wife came in for discussion about this muscle invasive disease and the seriousness of it. He elected for radical cystectomy and had been seen by Bertram Millard. Dahlstedt, M.D. for consultation about a continent urinary diversion which is also what the patient elected to have done. They were advised about the risks of infection, bleeding, medical complications, and postop incontinence. He was given mechanical and antibiotic bowel prep. He is admitted today for surgery.  PAST MEDICAL HISTORY:  He is in general good health. He has had some intermittent hypertension in the past. He has not required any therapy.  PAST SURGICAL HISTORY:  He has no previous surgery.  CURRENT MEDICATIONS:  He takes multivitamins and echinacea.  ALLERGIES:  None.  SOCIAL HISTORY:  Tobacco:  He quit in 1980. He drinks alcohol socially.  FAMILY HISTORY:  Noncontributory.  REVIEW OF SYSTEMS:  Noted on health history form.  PHYSICAL EXAMINATION:  GENERAL:  He is alert and oriented. He is in no acute distress.  SKIN:  Warm and dry.  VITAL SIGNS:  As recorded.  CHEST:  No respiratory distress.  HEART:  Heart tones regular.  ABDOMEN:  Soft and  nontender.  GENITALIA:  External genitalia normal. Prostate feels benign.  EXTREMITIES:  No edema.  IMPRESSION:  High-grade, muscle-invasive bladder carcinoma.  PLAN:  Radical cystoprostatectomy and continent urinary diversion. DD:  01/16/01 TD:  01/16/01 Job: [id number]

## 2011-04-22 NOTE — Discharge Summary (Signed)
[facility]  Patient:    Gregory Tate, Gregory Tate                       MRN: [id number] Adm. Date:  [id number] Disc. Date: 01/22/01 Attending:  Lauree Chandler CC:         Lilyan Punt. Sydnee Levans, M.D.  Bertram Millard. Dahlstedt, M.D.  Valentino Hue. Magrinat, M.D.   Discharge Summary  FINAL DIAGNOSIS:  High-grade muscle invasive bladder carcinoma.  PROCEDURES:  Radical cystoprostatectomy, bilateral pelvic lymph node dissection, and urinary diversion (neobladder) on January 16, 2001.  HISTORY OF PRESENT ILLNESS:  This 60 year old gentleman had hematuria in December of 2001 and was found to have a bladder tumor on the floor of the left side of the bladder.  He underwent TUR of the bladder tumor and was found to have high-grade muscle invasive disease.  He was scheduled and counseled for surgical therapy.  HOSPITAL COURSE:  After bowel prep, he was admitted on January 16, 2001, with a physical examination that was noncontributory.  He underwent a radical cystoprostatectomy, bilateral pelvic lymph node dissection, and creation of a neobladder.  His final pathology demonstrated benign ureteral margins and negative lymph nodes, but he had high-grade invasive in situ transitional cell carcinoma extending through the muscularis into the perivesical connective tissue, but the surgical margins were free, which gave him a pathologic grade of pT3a.  His postoperative course was benign.  He had a little low-grade fever at first.  His bowel function progressed quite nicely.  His catheters all drained well.  His Blake drain was able to be removed on January 21, 2001.  He was seen in consultation by Valentino Hue. Magrinat, M.D., who is going to see him at a later date to counsel him about the question of postoperative chemotherapy at this time.  DISPOSITION:  He was ready on January 22, 2001.  DISCHARGE MEDICATIONS:  He was given Vicodin for pain.  WOUND CARE:  He can shower in  two days.  He will irrigate his ureteral catheters every six hours.  ACTIVITY:  He will go home on light activity.  CONDITION ON DISCHARGE:  He was sent home in good condition.  FOLLOW-UP:  He will call p.r.n. fevers or any other difficulties.  He will see either Bertram Millard. Dahlstedt, M.D., or me in one week for skin staple removal and postoperative check.  He will also see Valentino Hue. Magrinat, M.D., in two to three weeks for further consultation. DD:  01/22/01 TD:  01/22/01 Job: [id number]

## 2011-04-22 NOTE — Op Note (Signed)
[facility]  Patient:    Gregory Tate, Gregory Tate                       MRN: [id number] Proc. Date: 01/16/01 Adm. Date:  [id number] Attending:  Lauree Chandler CC:         Maretta Bees. Vonita Moss, M.D.   Operative Report  PREOPERATIVE DIAGNOSIS:  High-grade, muscle invasive transitional cell carcinoma of the bladder with left hydronephrosis.  POSTOPERATIVE DIAGNOSIS:  High-grade, muscle invasive transitional cell carcinoma of the bladder with left hydronephrosis.  PRINCIPAL PROCEDURE:  Creation of ileal neobladder.  CO-SURGEONS:  Bertram Millard. Retta Diones, M.D. and Maretta Bees. Vonita Moss, M.D.  BRIEF HISTORY:  A 60 year old male with muscle invasive, high-grade transitional cell carcinoma of the bladder.  He is status post TURBT by Dr. Vonita Moss.  Due to the aggressive nature of his disease, it was recommended that the patient undergo cystectomy.  The patient is young, quite active, and would be unduly influenced by an ostomy.  He was given the option of an ileal conduit versus a continent urinary diversion.  The patient is quite interested in a neobladder.  He as referred to me by Dr. Vonita Moss for disuccusion on this surgical procedure.  He is aware of the added risks and complications of this continent procedure over the ileal conduit.  He desires to proceed.  He has also been administered an antibiotic and mechanical bowel prep.  DESCRIPTION OF PROCEDURE:  The procedure commenced after cystectomy and bilateral pelvic lymph node dissections which are dictated separately by Dr. Vonita Moss.  The ureters were freed up to the pelvic rim with sharp and blunt dissection.  The left ureter was quite hydronephrotic.  The small bowel was inspected, and the mesentery was found to be quite thick.  At first, I was worried about not being able to get the neobladder down to the urethral stump. However, the distal ileum was found to stretch to the urethra.  A 60 cm segment was isolated,  with the distal end being approximately 8 to 10 cm from the ileocecal valve.  The mesentery was marked and divided.  The segment was then stapled proximally and distally with the GIA.  The remaining ileum was then reanastomosed with the GIA and a TA stapler using a side-to-side, functional end-to-end technique.  The mesenteric window was then closed with 3-0 silk popoffs.  Additionally, a stitch was placed at the mesenteric side of the anastomosis to add strength to the stapled line of anastomosis.  The stapled ends of the neobladder segment were then opened, and the ileal segment was opened along its antimesenteric border using electrocautery.  A VIP pouch was then formed.  The neourethra was formed 15 cm from the proximal end of the isolated segment by closing it over a 24-French Gruenwald sound. The inferior segment was run with 2-0 PDS, creating nice tubularized bladder neck.  The posterior wall of this bladder neck was also run with the 2-0 PDS. The proximal segment to that, all 45 cm, was spiraled around with the very proximal end being brought down to the posterior side of the neourethra.  That wall was closed using a running 2-0 PDS.  The distal end of the ileal segment, the 15 cm segment, was run along the right side of the posterior wall closure. This formed a nice posterior plate to this neobladder.  Ureteroneocystostomies were then performed.  On the left side, the ureter had to be brought through the mesentery  partially in a spot that was quite nice for this anastomosis and without putting the ureter on much stress.  The end of the ureter did not need to be spatulated because of its hydronephrotic nature.  The anastomosis was performed using 4-0 PDS placed in a simple interrupted fashion.  Prior to completion of this anastomosis, a 90-French single J stent was  placed up into the left renal pelvis through the ureter.  Once the wire was removed, the stent was sewn to the  posterior plate of the neobladder using a 3-0 chromic. A similar procedure was done with the right ureter, bringing it through the top of the neobladder on the right side.  The anastomosis was performed after the ureter was spatulated.  Again, 4-0 PDS was used to perform the anastomosis with the sutures placed in simple interrupted fashion.  A similar 90 cm single J stent was placed into the right renal pelvis and again sutured to the back plate of the neobladder using 3-0 chromic.  The stents were brought through the neourethra and then brought through the patients own urethra.  A 24-French Rusch hematuria catheter was brought through the patients urethra and through the neourethra and the balloon filled with 30 cc of saline.  The neobladder was then flipped down and closed. The closure was performed with two separate lines of 2-0 PDS, one on the right and one on the left side of the closure.  Following closure, the neobladder was filled with approximately 150 cc of irrigation with bug juice.  No leakage was seen through either ureteral neohiatus or along any suture lines.  The anastomosis between the neourethra and the patients own urethra was then performed using circumferentially placed 2-0 PDS.  Care was taken to avoid suturing any of the stents or the catheter.  Once the anastomosis had been performed, it was quite evident that there was not much tension on this anastomosis.  The neobladder was then again filled with irrigation, and no leakage was seen from the urethral closure or the suture lines.  At this point, the pelvis was irrigated with bug juice.  A 10 mm flat fluted Blake drain was brought through the right lower quadrant and sutured to the skin using 3-0 silk.  The abdominal closure was performed using a running 0 PDS. Staples were then placed.  A dry sterile dressing was placed.  The stents were then taped to the Rusch catheter.  The [place] catheter adapter apparatus  was then placed.  The tubes were then fastened to the leg with a catheter strap.  The patient tolerated the procedure well.  He was extubated, awakened, and  taken to the PACU in stable condition. DD:  01/17/01 TD:  01/17/01 Job: [id number]

## 2011-04-22 NOTE — Op Note (Signed)
[facility]  Patient:    KENRIC, GINGER                       MRN: [id number] Proc. Date: 01/16/01 Adm. Date:  [id number] Attending:  Lauree Chandler CC:         Lilyan Punt. Sydnee Levans, M.D., [facility]  Bertram Millard. Dahlstedt, M.D.   Operative Report  PREOPERATIVE DIAGNOSIS:  Muscle invasive bladder carcinoma.  POSTOPERATIVE DIAGNOSIS:  Muscle invasive bladder carcinoma.  OPERATION:  Radical cystoprostatectomy and bilateral pelvic lymph node dissection, and continent urinary diversion.  CO-SURGEONS:  Maretta Bees. Vonita Moss, M.D., Bertram Millard. Dahlstedt, M.D.  ANESTHESIA:  General.  INDICATIONS:  This 60 year old gentleman had gross hematuria and during work-up was found to have a large bladder tumor on the left floor of the bladder, and he was found to have high-grade muscle invasive disease.  He was counseled about surgical therapy, and he opted to go ahead with surgery as noted above.  The radical cystoprostatectomy and lymph node dissection will be performed by me, and Dr. Retta Diones will do the continent urinary diversion, dictated separately.  DESCRIPTION OF PROCEDURE:  The patient was brought to the operating room and placed in the supine position with some extension.  The lower abdomen and external genitalia were prepped and draped in the usual fashion.  A 24 French 30 cc Foley was inserted paraurethra.  A lower midline vertical incision was made in the rectus fascia and divided in the midline.  The peritoneum was opened up at the top part of our incision and the bladder identified.  The bladder had been irrigated out with triple antibiotic solution.  The peritoneal reflection was dissected off laterally where we came across each vas deferens and then down to the cul-de-sac.  Dissection was carried down bilaterally using the metal hemoclips and Ligasure for hemostasis and control of the vessels.  We came down upon the pelvic ureter  on the right side and dissected it down to just outside the bladder wall and isolated it, divided it, and sent the tips for pathology, and the frozen section was negative.  In a like fashion, the left ureter was dissected down, which was somewhat hydronephrotic, and it was taken off just outside the bladder, and the tip was sent for frozen section and was free of tumor.  The superior and inferior vesicle arteries were dissected out along with the surrounding tissue, and the ligature cautery unit provided good hemostasis.  Dissection was carried down to the base of the bladder.  At this point, the endopelvic fascia was taken down, and the anterior aspect of the prostate approached.  The dorsal vein complex was isolated with a Hohenfeltner clamp and a heavy Vicryl placed around it subsequently during the course of the procedure, and 2-0 Vicryl figure-of-eight sutures were placed in the dorsal vein complex for hemostasis. There was one superficial bleeder of the pubis that required some pressure to control the bleeding.  The posterior aspect of the prostate was mobilized and as we came down around the left side of the bladder, palpable induration was noted, and we got a nice surgical margin, and the bladder came up nicely off the rectum as did the prostate.  At this point, the apex of the prostate was mobilized by dividing between it and the membranous urethra.  Posterior urethra was divided, and the prostate brought up off the rectum and the specimen removed intact.  Some further hemostatic  measures were taken in the pelvic floor area with some metal hemoclips and 3-0 chromic catgut.  We then turned our attention to the right obturator fossa, and the right obturator and pelvic lymph nodes were dissected out with hemostasis using metal hemoclips. The main vascular structures and obturator nerve were preserved.  In like fashion, the left pelvic obturator lymph node packet was excised and  these lymph nodes packets sent for permanent section.  At this point, after getting good hemostasis, Dr. Retta Diones proceeded with the continent diversion, and he will dictate that separately.  After completion of the continent diversion, the wound was irrigated with triple antibiotic solution.  Blake drain was placed on the left over incision through a stab wound.  The rectus fascia was closed with running #1 PDS.  The subcutaneous was irrigated, and the skin was closed with skin staples.  The wound was cleaned and dressed with dry sterile gauze dressing.  The Blake drain was connected to bulb suction.  The Foley catheter was connected to closed drainage along the single-J stents placed in each ureter.  The patient tolerated the procedure well.  Sponge, needle and instrument counts were correct.  Estimated blood loss was 1300 cc.  He remained hemodynamically stable. DD:  01/16/01 TD:  01/17/01 Job: [id number]

## 2011-04-28 ENCOUNTER — Encounter: Payer: Self-pay | Admitting: Internal Medicine

## 2011-05-17 ENCOUNTER — Ambulatory Visit (INDEPENDENT_AMBULATORY_CARE_PROVIDER_SITE_OTHER): Payer: PRIVATE HEALTH INSURANCE | Admitting: Family

## 2011-05-17 ENCOUNTER — Encounter: Payer: Self-pay | Admitting: Family

## 2011-05-17 DIAGNOSIS — IMO0002 Reserved for concepts with insufficient information to code with codable children: Secondary | ICD-10-CM

## 2011-05-17 DIAGNOSIS — S39012A Strain of muscle, fascia and tendon of lower back, initial encounter: Secondary | ICD-10-CM

## 2011-05-17 MED ORDER — MELOXICAM 7.5 MG PO TABS: 7.5000 mg | ORAL_TABLET | Freq: Every day | ORAL | Status: AC

## 2011-05-17 MED ORDER — CYCLOBENZAPRINE HCL 5 MG PO TABS: 5.0000 mg | ORAL_TABLET | Freq: Three times a day (TID) | ORAL | Status: AC | PRN

## 2011-05-17 NOTE — Progress Notes (Signed)
Subjective:    Patient ID: Gregory Tate, male    DOB: 1951/06/03, 60 y.o.   MRN: [id number]  HPI  Gregory Tate is a 60 year old male who presents with complaint of low back ache/spasm (left lower back).  Pain is described as "spasm, grabbing pain."  "Took me to my knees."  He denies past medical history of back problems.   He continues to have some numbness on the left lateral thigh.  He denies hematuria.  Denies previous history of low back pain. Denies LE weakness, falls, or balance difficulty.     Review of Systems See history of present illness  Past Medical History  Diagnosis Date  . Hypertension   . Hyperlipidemia   . 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 Fatty liver     mild    History   Social History  . Marital Status: Married    Spouse Name: N/A    Number of Children: N/A  . Years of Education: N/A   Occupational History  . Not on file.   Social History Main Topics  . Smoking status: Never Smoker   . Smokeless tobacco: Not on file  . Alcohol Use: Not on file  . Drug Use: Not on file  . Sexually Active: Not on file   Other Topics Concern  . 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   Hotel companyMarried for 32 year one son 66 New grandchildAlcohol use-yes (social)  < 14 drinks per week.    Past Surgical History  Procedure Date  . Colonoscopy 2007  . Urinary diversion 2002    radical cystoprostatecomy and urinary diversion     Family History  Problem Relation Age of Onset  . Coronary artery disease Father   . Hypertension Mother   . Hypertension Father   . Diabetes Brother     borderline    No Known Allergies  Current Outpatient Prescriptions on File Prior to Visit  Medication Sig Dispense Refill  . aspirin 81 MG tablet Take 81 mg by mouth  daily.        . Cholecalciferol (VITAMIN D3) 1000 UNITS tablet Take 1,000 Units by mouth daily.        . Coenzyme Q10 (CO Q 10) 100 MG CAPS Take 100 mg by mouth daily.        Marland Kitchen lisinopril-hydrochlorothiazide (PRINZIDE,ZESTORETIC) 20-25 MG per tablet Take 1 tablet by mouth daily.        . simvastatin (ZOCOR) 40 MG tablet Take 40 mg by mouth at bedtime.        . tadalafil (CIALIS) 20 MG tablet Take 20 mg by mouth daily as needed.          BP 134/76  Pulse 72  Temp(Src) 98.4 F (36.9 C) (Oral)  Resp 16  Ht 6\' 1"  (1.854 m)  Wt 248 lb 0.6 oz (112.51 kg)  BMI 32.72 kg/m2       Objective:   Physical Exam    general: Awake, alert, and in no acute distress Cardiovascular: S1, S2, regular rate and rhythm Respiratory: Breath sounds clear to auscultation bilaterally without wheezes rales or rhonchi Muscle skeletal: Negative straight leg raise bilaterally, patient is able to toe walk and heel walk without difficulty  Assessment & Plan:

## 2011-05-17 NOTE — Patient Instructions (Addendum)
Call if symptoms worsen or if they do not improve. Follow up in 1 month.A

## 2011-05-24 DIAGNOSIS — S39012A Strain of muscle, fascia and tendon of lower back, initial encounter: Secondary | ICD-10-CM | POA: Insufficient documentation

## 2011-05-24 NOTE — Assessment & Plan Note (Signed)
Will start patient on Mobic and Flexeril. Plan followup in one month, sooner if symptoms worsen. If no improvement will consider further imaging such as MRI of the lumbar spine.

## 2011-06-03 ENCOUNTER — Telehealth: Payer: Self-pay | Admitting: *Deleted

## 2011-06-03 NOTE — Telephone Encounter (Signed)
Patient called and left voice message stating he is still having back pain/spasms.  Patient had CT scan by urologist ( Dr Alma Friendly) and he wanted to know if the CT Scan done by urology would show if he had any back issues. He stated that he was advised that he would need to have an additional test done, and wanted to know if this could be looked at first. Patient stated he has requested the records to be forwarded to our office.

## 2011-06-03 NOTE — Telephone Encounter (Signed)
I reviewed the CT scan that was performed.  The results do not detail the spinal area.  If his pain is not improving, I would suggest that he have an MRI of his spine performed which gives [place] the clearest picture of the discs and nerves in his back.

## 2011-06-06 NOTE — Telephone Encounter (Signed)
Left message on machine to return my call. 

## 2011-06-07 NOTE — Telephone Encounter (Signed)
Call placed to patient, he was informed per Gregory Tate instructions. He states that his pain was improving and he will see how it goes. Nothing further needed at this time

## 2011-10-19 ENCOUNTER — Telehealth: Payer: Self-pay | Admitting: Internal Medicine

## 2011-10-19 ENCOUNTER — Ambulatory Visit (INDEPENDENT_AMBULATORY_CARE_PROVIDER_SITE_OTHER): Payer: PRIVATE HEALTH INSURANCE | Admitting: Internal Medicine

## 2011-10-19 ENCOUNTER — Encounter: Payer: Self-pay | Admitting: Internal Medicine

## 2011-10-19 VITALS — BP 124/80 | HR 84 | Temp 98.3°F | Resp 18 | Wt 253.0 lb

## 2011-10-19 DIAGNOSIS — Z79899 Other long term (current) drug therapy: Secondary | ICD-10-CM

## 2011-10-19 DIAGNOSIS — I10 Essential (primary) hypertension: Secondary | ICD-10-CM

## 2011-10-19 DIAGNOSIS — E785 Hyperlipidemia, unspecified: Secondary | ICD-10-CM

## 2011-10-19 DIAGNOSIS — M79629 Pain in unspecified upper arm: Secondary | ICD-10-CM | POA: Insufficient documentation

## 2011-10-19 DIAGNOSIS — J069 Acute upper respiratory infection, unspecified: Secondary | ICD-10-CM | POA: Insufficient documentation

## 2011-10-19 DIAGNOSIS — M79609 Pain in unspecified limb: Secondary | ICD-10-CM

## 2011-10-19 NOTE — Patient Instructions (Signed)
Please schedule cbc 401.9, chem7 v58.69, lipid/lft 272.4 for Friday morning fasting Also please schedule chem7 v58.69 and lipid/lft 272.4 prior to next appointment

## 2011-10-19 NOTE — Assessment & Plan Note (Signed)
otc sx relief prn. Followup if no improvement or worsening.

## 2011-10-19 NOTE — Assessment & Plan Note (Signed)
Normotensive and stable. Continue current regimen. Monitor bp as outpt and followup in clinic as scheduled. Obtain cbc, chem7. EKG obtained demonstrates nsr with nl intervals and axis.

## 2011-10-19 NOTE — Assessment & Plan Note (Signed)
Clinical presentation most c/w msk etiology.

## 2011-10-19 NOTE — Assessment & Plan Note (Signed)
Obtain lipid/lft. 

## 2011-10-19 NOTE — Progress Notes (Signed)
  Subjective:    Patient ID: Gregory Tate, male    DOB: 09/07/1951, 60 y.o.   MRN: [id number]  HPI Pt presents to clinic for followup of multiple medical problems. Notes two day h/o throat irritation, frontal ha and congestion without f/c. Had transient left upper axilla pain that resolved with direct pressure. Was concerned could be cardiac related. Denies cp, chest pressure, dyspnea at rest or exertion. H/o HTN and hyperlipidemia due for lab work. BP reviewed as normotensive. No other complaints.  Past Medical History  Diagnosis Date  . Hypertension   . Hyperlipidemia   . 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 Fatty liver     mild   Past Surgical History  Procedure Date  . Colonoscopy 2007  . Urinary diversion 2002    radical cystoprostatecomy and urinary diversion     reports that he has never smoked. He has never used smokeless tobacco. He reports that he drinks alcohol. He reports that he does not use illicit drugs. family history includes Coronary artery disease in his father; Diabetes in his brother; and Hypertension in his father and mother. No Known Allergies   Review of Systems see hpi      Objective:   Physical Exam  Nursing note and vitals reviewed. Constitutional: He appears well-developed and well-nourished. No distress.  HENT:  Head: Normocephalic and atraumatic.  Right Ear: Tympanic membrane, external ear and ear canal normal.  Left Ear: Tympanic membrane, external ear and ear canal normal.  Nose: Mucosal edema and rhinorrhea present.  Mouth/Throat: Mucous membranes are normal. No oropharyngeal exudate, posterior oropharyngeal edema or posterior oropharyngeal erythema.  Eyes: Conjunctivae are normal. No scleral icterus.  Cardiovascular: Normal rate, regular rhythm and normal heart sounds.  Exam reveals no gallop and no friction rub.   No murmur  heard. Pulmonary/Chest: Effort normal and breath sounds normal. No respiratory distress. He has no wheezes. He has no rales.  Musculoskeletal:       Left axilla: no tenderness or mass.  Skin: Skin is warm and dry. He is not diaphoretic.  Psychiatric: He has a normal mood and affect.          Assessment & Plan:

## 2011-10-20 NOTE — Telephone Encounter (Signed)
Lab orders entered for November 2012 & April of 2013.

## 2011-10-21 ENCOUNTER — Other Ambulatory Visit: Payer: Self-pay | Admitting: *Deleted

## 2011-10-21 DIAGNOSIS — Z79899 Other long term (current) drug therapy: Secondary | ICD-10-CM

## 2011-10-21 DIAGNOSIS — E785 Hyperlipidemia, unspecified: Secondary | ICD-10-CM

## 2011-10-21 DIAGNOSIS — I10 Essential (primary) hypertension: Secondary | ICD-10-CM

## 2011-10-22 LAB — BASIC METABOLIC PANEL
CO2: 30 mEq/L (ref 19–32)
Chloride: 101 mEq/L (ref 96–112)
Creat: 0.88 mg/dL (ref 0.50–1.35)
Sodium: 140 mEq/L (ref 135–145)

## 2011-10-22 LAB — LIPID PANEL
HDL: 50 mg/dL (ref >39)
LDL Cholesterol: 94 mg/dL (ref 0–99)
Total CHOL/HDL Ratio: 3.4 Ratio
Triglycerides: 126 mg/dL (ref <150)

## 2011-10-22 LAB — CBC
HCT: 46.5 % (ref 39.0–52.0)
Hemoglobin: 15.8 g/dL (ref 13.0–17.0)
WBC: 5.7 10*3/uL (ref 4.0–10.5)

## 2011-10-22 LAB — HEPATIC FUNCTION PANEL
AST: 19 U/L (ref 0–37)
Albumin: 4.3 g/dL (ref 3.5–5.2)
Alkaline Phosphatase: 41 U/L (ref 39–117)
Total Bilirubin: 0.6 mg/dL (ref 0.3–1.2)
Total Protein: 6.6 g/dL (ref 6.0–8.3)

## 2011-10-28 ENCOUNTER — Other Ambulatory Visit: Payer: Self-pay | Admitting: Internal Medicine

## 2011-10-28 DIAGNOSIS — R7309 Other abnormal glucose: Secondary | ICD-10-CM

## 2012-01-24 ENCOUNTER — Telehealth: Payer: Self-pay | Admitting: Internal Medicine

## 2012-01-24 MED ORDER — LISINOPRIL-HYDROCHLOROTHIAZIDE 20-25 MG PO TABS: 1.0000 | ORAL_TABLET | Freq: Every day | ORAL | Status: DC

## 2012-01-24 NOTE — Telephone Encounter (Signed)
Rx refill sent to pharmacy. 

## 2012-01-25 ENCOUNTER — Telehealth: Payer: Self-pay | Admitting: Internal Medicine

## 2012-01-25 MED ORDER — SIMVASTATIN 40 MG PO TABS: 40.0000 mg | ORAL_TABLET | Freq: Every day | ORAL | Status: DC

## 2012-01-25 NOTE — Telephone Encounter (Signed)
Rx refill sent to pharmacy. 

## 2012-01-25 NOTE — Telephone Encounter (Signed)
Refill-simvastatin oral tablet 40mg . Take one tablet each evening. Qty 90 last fill 11.6.12

## 2012-03-19 ENCOUNTER — Ambulatory Visit: Payer: PRIVATE HEALTH INSURANCE | Admitting: Internal Medicine

## 2012-03-19 DIAGNOSIS — Z0289 Encounter for other administrative examinations: Secondary | ICD-10-CM

## 2012-05-24 ENCOUNTER — Ambulatory Visit (HOSPITAL_BASED_OUTPATIENT_CLINIC_OR_DEPARTMENT_OTHER)
Admission: RE | Admit: 2012-05-24 | Discharge: 2012-05-24 | Disposition: A | Discharge status: Home / Self Care | Payer: PRIVATE HEALTH INSURANCE | Source: Ambulatory Visit | Attending: Internal Medicine | Admitting: Internal Medicine

## 2012-05-24 ENCOUNTER — Encounter: Payer: Self-pay | Admitting: Internal Medicine

## 2012-05-24 ENCOUNTER — Ambulatory Visit (INDEPENDENT_AMBULATORY_CARE_PROVIDER_SITE_OTHER): Payer: PRIVATE HEALTH INSURANCE | Admitting: Internal Medicine

## 2012-05-24 VITALS — BP 116/76 | HR 78 | Temp 98.3°F | Resp 18 | Ht 73.0 in | Wt 247.0 lb

## 2012-05-24 DIAGNOSIS — M25569 Pain in unspecified knee: Secondary | ICD-10-CM | POA: Insufficient documentation

## 2012-05-24 DIAGNOSIS — M7989 Other specified soft tissue disorders: Secondary | ICD-10-CM

## 2012-05-24 MED ORDER — DICLOFENAC SODIUM 75 MG PO TBEC: DELAYED_RELEASE_TABLET | ORAL | Status: AC

## 2012-05-24 MED ORDER — AMOXICILLIN-POT CLAVULANATE 875-125 MG PO TABS: 1.0000 | ORAL_TABLET | Freq: Two times a day (BID) | ORAL | Status: AC

## 2012-05-24 NOTE — Progress Notes (Signed)
  Subjective:    Patient ID: Gregory Tate, male    DOB: May 14, 1951, 61 y.o.   MRN: [id number]  HPI Pt presents to clinic for evaluation of knee pain. Notes one week h/o right knee swelling, redness and warmth overlying the knee cap anteriorly. No injury/trauma or break in skin. Now notes some degree of swelling of right calf and ankle. No VTE risk factors. Missed last appt. BP reviewed normotensive. Requests ED medication samples. Has tolerated cialis in the past.   Past Medical History  Diagnosis Date  . Hypertension   . Hyperlipidemia   . 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 Fatty liver     mild   Past Surgical History  Procedure Date  . Colonoscopy 2007  . Urinary diversion 2002    radical cystoprostatecomy and urinary diversion     reports that he has never smoked. He has never used smokeless tobacco. He reports that he drinks alcohol. He reports that he does not use illicit drugs. family history includes Coronary artery disease in his father; Diabetes in his brother; and Hypertension in his father and mother. No Known Allergies   Review of Systems see hpi     Objective:   Physical Exam  Nursing note and vitals reviewed. Constitutional: He appears well-developed and well-nourished. No distress.  Musculoskeletal:       Right knee- FROM, no crepitus. Able to wt bear and ambulate without difficulty. Mild ST swelling/redness and warmth overlying patella. No effusion. Right calf mildly swollen. No palpable cords. homan's neg  Neurological: He is alert.  Skin: Skin is warm and dry. No rash noted. He is not diaphoretic. There is erythema. No pallor.  Psychiatric: He has a normal mood and affect.          Assessment & Plan:

## 2012-05-24 NOTE — Assessment & Plan Note (Signed)
Obtain LE [place]

## 2012-05-24 NOTE — Assessment & Plan Note (Signed)
Suspect bursitis. Cannot excluded infectious etiology. Begin augmentin with voltaren (take with food and no other nsaids). Close f/u scheduled.

## 2012-05-25 ENCOUNTER — Other Ambulatory Visit: Payer: Self-pay | Admitting: *Deleted

## 2012-05-25 DIAGNOSIS — E785 Hyperlipidemia, unspecified: Secondary | ICD-10-CM

## 2012-05-25 DIAGNOSIS — Z79899 Other long term (current) drug therapy: Secondary | ICD-10-CM

## 2012-05-25 DIAGNOSIS — R7309 Other abnormal glucose: Secondary | ICD-10-CM

## 2012-05-25 LAB — LIPID PANEL
LDL Cholesterol: 68 mg/dL (ref 0–99)
Total CHOL/HDL Ratio: 3.2 Ratio
VLDL: 16 mg/dL (ref 0–40)

## 2012-05-25 LAB — BASIC METABOLIC PANEL
BUN: 23 mg/dL (ref 6–23)
CO2: 27 mEq/L (ref 19–32)
Glucose, Bld: 110 mg/dL — ABNORMAL HIGH (ref 70–99)
Potassium: 4.3 mEq/L (ref 3.5–5.3)
Sodium: 137 mEq/L (ref 135–145)

## 2012-05-25 LAB — HEPATIC FUNCTION PANEL
Bilirubin, Direct: 0.2 mg/dL (ref 0.0–0.3)
Total Bilirubin: 0.7 mg/dL (ref 0.3–1.2)

## 2012-05-30 ENCOUNTER — Ambulatory Visit (INDEPENDENT_AMBULATORY_CARE_PROVIDER_SITE_OTHER): Payer: PRIVATE HEALTH INSURANCE | Admitting: Internal Medicine

## 2012-05-30 ENCOUNTER — Encounter: Payer: Self-pay | Admitting: Internal Medicine

## 2012-05-30 ENCOUNTER — Telehealth: Payer: Self-pay | Admitting: Internal Medicine

## 2012-05-30 VITALS — BP 118/80 | HR 80 | Temp 97.7°F | Resp 18 | Ht 73.0 in | Wt 246.0 lb

## 2012-05-30 DIAGNOSIS — E785 Hyperlipidemia, unspecified: Secondary | ICD-10-CM

## 2012-05-30 DIAGNOSIS — R7309 Other abnormal glucose: Secondary | ICD-10-CM

## 2012-05-30 DIAGNOSIS — M25569 Pain in unspecified knee: Secondary | ICD-10-CM

## 2012-05-30 DIAGNOSIS — I10 Essential (primary) hypertension: Secondary | ICD-10-CM

## 2012-05-30 NOTE — Assessment & Plan Note (Signed)
Mild elevation. Low sugar/carb diet. Will follow

## 2012-05-30 NOTE — Telephone Encounter (Signed)
Please schedule fasting labs prior to next visit  Chem7, a1c-hyperglycemia and lipid/lft 272.4  Patient has appointment in mid-December. He will be going to [facility] lab.

## 2012-05-30 NOTE — Telephone Encounter (Signed)
Lab order entered for December 2013. 

## 2012-05-30 NOTE — Progress Notes (Signed)
  Subjective:    Patient ID: Gregory Tate, male    DOB: Dec 06, 1950, 61 y.o.   MRN: [id number]  HPI Pt presents to clinic for follow up of knee pain and swelling. Nearly completed augmentin and is taking voltaren without gi adverse effect/side effects. Knee pain and swelling definitely improved. Leg swelling resolved. Reviewed mildly elevated a1c with no personal h/o dm. Tolerating statin tx without myalgias or abn lft.  Past Medical History  Diagnosis Date  . Hypertension   . Hyperlipidemia   . 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 Fatty liver     mild   Past Surgical History  Procedure Date  . Colonoscopy 2007  . Urinary diversion 2002    radical cystoprostatecomy and urinary diversion     reports that he has never smoked. He has never used smokeless tobacco. He reports that he drinks alcohol. He reports that he does not use illicit drugs. family history includes Coronary artery disease in his father; Diabetes in his brother; and Hypertension in his father and mother. No Known Allergies   Review of Systems see hpi     Objective:   Physical Exam  Nursing note and vitals reviewed. Constitutional: He appears well-developed and well-nourished. No distress.  HENT:  Head: Normocephalic and atraumatic.  Musculoskeletal:       Right knee: improved patella swelling and redness. FROM. Gait nl  Skin: He is not diaphoretic.          Assessment & Plan:

## 2012-05-30 NOTE — Assessment & Plan Note (Signed)
Improving. Complete abx. Continue voltaren prn with food. Followup if no improvement or worsening.

## 2012-05-30 NOTE — Patient Instructions (Signed)
Please schedule fasting labs prior to next visit Chem7, a1c-hyperglycemia and lipid/lft-272.4 

## 2012-05-30 NOTE — Assessment & Plan Note (Signed)
Normotensive and stable. Continue current regimen. Monitor bp as outpt and followup in clinic as scheduled.  

## 2012-07-24 ENCOUNTER — Telehealth: Payer: Self-pay | Admitting: Internal Medicine

## 2012-07-24 MED ORDER — LISINOPRIL-HYDROCHLOROTHIAZIDE 20-25 MG PO TABS: 1.0000 | ORAL_TABLET | Freq: Every day | ORAL | Status: DC

## 2012-07-24 MED ORDER — SIMVASTATIN 40 MG PO TABS: 40.0000 mg | ORAL_TABLET | Freq: Every day | ORAL | Status: DC

## 2012-07-24 NOTE — Telephone Encounter (Signed)
Rx[s] Done/SLS 

## 2012-07-24 NOTE — Telephone Encounter (Signed)
Refill- lisinopril-hydrochlorothiazide 20-2. Take one tablet by mouth. Qty 90 last fill 5.18.13  Refill- simvastatin 40mg  tablet. Take one tablet by mouth at bedtime. Qty 90 last fill 5.18.13

## 2012-11-21 ENCOUNTER — Ambulatory Visit: Payer: PRIVATE HEALTH INSURANCE | Admitting: Internal Medicine

## 2013-01-14 ENCOUNTER — Other Ambulatory Visit: Payer: Self-pay | Admitting: Internal Medicine

## 2013-01-15 NOTE — Telephone Encounter (Signed)
Informed patient that 30 day supply of lisinopril has been sent in and that he needs to schedule an appointment before further refills can be given. Patient states that he is upset because he has to come in frequently because of this medication and will call back to reschedule  Appointement.

## 2013-01-15 NOTE — Telephone Encounter (Signed)
30 day supply of lisinopril hct sent to pt's pharmacy. Pt is past due for follow up appt and needs to be seen for further refills. Please call pt to arrange appt.

## 2013-01-15 NOTE — Telephone Encounter (Signed)
Ok but no further refills can be given until pt is seen.

## 2013-01-22 ENCOUNTER — Ambulatory Visit: Payer: PRIVATE HEALTH INSURANCE | Admitting: Family Medicine

## 2013-01-28 ENCOUNTER — Encounter: Payer: Self-pay | Admitting: Family Medicine

## 2013-01-28 ENCOUNTER — Ambulatory Visit (INDEPENDENT_AMBULATORY_CARE_PROVIDER_SITE_OTHER): Payer: 59 | Admitting: Family Medicine

## 2013-01-28 VITALS — BP 138/86 | HR 74 | Temp 98.1°F | Ht 73.0 in | Wt 246.1 lb

## 2013-01-28 DIAGNOSIS — L259 Unspecified contact dermatitis, unspecified cause: Secondary | ICD-10-CM

## 2013-01-28 DIAGNOSIS — E785 Hyperlipidemia, unspecified: Secondary | ICD-10-CM

## 2013-01-28 DIAGNOSIS — R739 Hyperglycemia, unspecified: Secondary | ICD-10-CM

## 2013-01-28 DIAGNOSIS — I10 Essential (primary) hypertension: Secondary | ICD-10-CM

## 2013-01-28 DIAGNOSIS — R7309 Other abnormal glucose: Secondary | ICD-10-CM

## 2013-01-28 DIAGNOSIS — L309 Dermatitis, unspecified: Secondary | ICD-10-CM

## 2013-01-28 HISTORY — DX: Dermatitis, unspecified: L30.9

## 2013-01-28 LAB — HEMOGLOBIN A1C
Hgb A1c MFr Bld: 5.7 % — ABNORMAL HIGH (ref <5.7)
Mean Plasma Glucose: 117 mg/dL — ABNORMAL HIGH (ref <117)

## 2013-01-28 MED ORDER — LISINOPRIL-HYDROCHLOROTHIAZIDE 20-25 MG PO TABS: 1.0000 | ORAL_TABLET | Freq: Every day | ORAL | Status: DC

## 2013-01-28 MED ORDER — CLOBETASOL PROPIONATE 0.05 % EX CREA: TOPICAL_CREAM | Freq: Every day | CUTANEOUS | Status: DC | PRN

## 2013-01-28 MED ORDER — SIMVASTATIN 40 MG PO TABS: 40.0000 mg | ORAL_TABLET | Freq: Every day | ORAL | Status: DC

## 2013-01-28 NOTE — Patient Instructions (Addendum)
Consider switching to [employer], Mega[employer] one daily  Next visit annual exam with labs prior to visit, lipid, renal, hepatic, cbc, tsh, hgba1c    Eczema Atopic dermatitis, or eczema, is an inherited type of sensitive skin. Often people with eczema have a family history of allergies, asthma, or hay fever. It causes a red itchy rash and dry scaly skin. The itchiness may occur before the skin rash and may be very intense. It is not contagious. Eczema is generally worse during the cooler winter months and often improves with the warmth of summer. Eczema usually starts showing signs in infancy. Some children outgrow eczema, but it may last through adulthood. Flare-ups may be caused by:  Eating something or contact with something you are sensitive or allergic to.  Stress. DIAGNOSIS  The diagnosis of eczema is usually based upon symptoms and medical history. TREATMENT  Eczema cannot be cured, but symptoms usually can be controlled with treatment or avoidance of allergens (things to which you are sensitive or allergic to).  Controlling the itching and scratching.  Use over-the-counter antihistamines as directed for itching. It is especially useful at night when the itching tends to be worse.  Use over-the-counter steroid creams as directed for itching.  Scratching makes the rash and itching worse and may cause impetigo (a skin infection) if fingernails are contaminated (dirty).  Keeping the skin well moisturized with creams every day. This will seal in moisture and help prevent dryness. Lotions containing alcohol and water can dry the skin and are not recommended.  Limiting exposure to allergens.  Recognizing situations that cause stress.  Developing a plan to manage stress. HOME CARE INSTRUCTIONS   Take prescription and over-the-counter medicines as directed by your caregiver.  Do not use anything on the skin without checking with your caregiver.  Keep baths or showers short (5  minutes) in warm (not hot) water. Use mild cleansers for bathing. You may add non-perfumed bath oil to the bath water. It is best to avoid soap and bubble bath.  Immediately after a bath or shower, when the skin is still damp, apply a moisturizing ointment to the entire body. This ointment should be a petroleum ointment. This will seal in moisture and help prevent dryness. The thicker the ointment the better. These should be unscented.  Keep fingernails cut short and wash hands often. If your child has eczema, it may be necessary to put soft gloves or mittens on your child at night.  Dress in clothes made of cotton or cotton blends. Dress lightly, as heat increases itching.  Avoid foods that may cause flare-ups. Common foods include cow's milk, peanut butter, eggs and wheat.  Keep a child with eczema away from anyone with fever blisters. The virus that causes fever blisters (herpes simplex) can cause a serious skin infection in children with eczema. SEEK MEDICAL CARE IF:   Itching interferes with sleep.  The rash gets worse or is not better within one week following treatment.  The rash looks infected (pus or soft yellow scabs).  You or your child has an oral temperature above 102 F (38.9 C).  Your baby is older than 3 months with a rectal temperature of 100.5 F (38.1 C) or higher for more than 1 day.  The rash flares up after contact with someone who has fever blisters. SEEK IMMEDIATE MEDICAL CARE IF:   Your baby is older than 3 months with a rectal temperature of 102 F (38.9 C) or higher.  Your  baby is older than 3 months or younger with a rectal temperature of 100.4 F (38 C) or higher. Document Released: 11/18/2000 Document Revised: 02/13/2012 Document Reviewed: 09/23/2009 [facility] Patient Information 2013 [place], [place]. DASH Diet The DASH diet stands for "Dietary Approaches to Stop Hypertension." It is a healthy eating plan that has been shown to reduce high blood  pressure (hypertension) in as little as 14 days, while also possibly providing other significant health benefits. These other health benefits include reducing the risk of breast cancer after menopause and reducing the risk of type 2 diabetes, heart disease, colon cancer, and stroke. Health benefits also include weight loss and slowing kidney failure in patients with chronic kidney disease.  DIET GUIDELINES  Limit salt (sodium). Your diet should contain less than 1500 mg of sodium daily.  Limit refined or processed carbohydrates. Your diet should include mostly whole grains. Desserts and added sugars should be used sparingly.  Include small amounts of heart-healthy fats. These types of fats include nuts, oils, and tub margarine. Limit saturated and trans fats. These fats have been shown to be harmful in the body. CHOOSING FOODS  The following food groups are based on a 2000 calorie diet. See your Registered Dietitian for individual calorie needs. Grains and Grain Products (6 to 8 servings daily)  Eat More Often: Whole-wheat bread, brown rice, whole-grain or wheat pasta, quinoa, popcorn without added fat or salt (air popped).  Eat Less Often: White bread, white pasta, white rice, cornbread. Vegetables (4 to 5 servings daily)  Eat More Often: Fresh, frozen, and canned vegetables. Vegetables may be raw, steamed, roasted, or grilled with a minimal amount of fat.  Eat Less Often/Avoid: Creamed or fried vegetables. Vegetables in a cheese sauce. Fruit (4 to 5 servings daily)  Eat More Often: All fresh, canned (in natural juice), or frozen fruits. Dried fruits without added sugar. One hundred percent fruit juice ( cup [237 mL] daily).  Eat Less Often: Dried fruits with added sugar. Canned fruit in light or heavy syrup. [employer], Fish, and Poultry (2 servings or less daily. One serving is 3 to 4 oz [85-114 g]).  Eat More Often: Ninety percent or leaner ground beef, tenderloin, sirloin. Round cuts  of beef, chicken breast, [place] breast. All fish. Grill, bake, or broil your meat. Nothing should be fried.  Eat Less Often/Avoid: Fatty cuts of meat, [place], or chicken leg, thigh, or wing. Fried cuts of meat or fish. Dairy (2 to 3 servings)  Eat More Often: Low-fat or fat-free milk, low-fat plain or light yogurt, reduced-fat or part-skim cheese.  Eat Less Often/Avoid: Milk (whole, 2%).Whole milk yogurt. Full-fat cheeses. Nuts, Seeds, and Legumes (4 to 5 servings per week)  Eat More Often: All without added salt.  Eat Less Often/Avoid: Salted nuts and seeds, canned beans with added salt. Fats and Sweets (limited)  Eat More Often: Vegetable oils, tub margarines without trans fats, sugar-free gelatin. Mayonnaise and salad dressings.  Eat Less Often/Avoid: Coconut oils, palm oils, butter, stick margarine, cream, half and half, cookies, candy, pie. FOR MORE INFORMATION The Dash Diet Eating Plan: www.dashdiet.org Document Released: 11/10/2011 Document Revised: 02/13/2012 Document Reviewed: 11/10/2011 [facility] Patient Information 2013 [place], [place].

## 2013-01-28 NOTE — Assessment & Plan Note (Signed)
Right hand lesion c/w eczema and a second lesion at gluteal cleft on left , start with Clobetasol cream qhs and report any concerns or worsening symptoms

## 2013-01-28 NOTE — Assessment & Plan Note (Signed)
Repeat hgba1c today, minimize simple carbs

## 2013-01-28 NOTE — Assessment & Plan Note (Signed)
Adequately controlled, encouraged DASH diet

## 2013-01-28 NOTE — Assessment & Plan Note (Signed)
Encouraged switch from fish oil to krill oil daily

## 2013-01-28 NOTE — Progress Notes (Signed)
Patient ID: Gregory Tate, male   DOB: [birth year], 62 y.o.   MRN: [id number] ADMIR CANDELAS [id number] 27-Sep-1951 01/28/2013      Progress Note-Follow Up  Subjective  Chief Complaint  Chief Complaint  Patient presents with  . Follow-up    6 month  . Rash    on butt "like a diaper rash"- red, itchy, burns X 2-3 weeks, has tried Desitin, Desitin Plus, and Neosporin- helped a little    HPI  Patient is a 62 year old Caucasian male who is in today for followup. His major complaint today is rash. Describes an intermittent rash gluteal cleft her to 3 weeks. He's had similar rashes in the past but there was clear quickly. He tried multiple Desitin products as well as Neosporin. Neosporin seems to help slightly. The rashes. She, itchy and burns at times. Has a similar rash in his right hand. No other recent illness. No fevers or chills. No chest pain or palpitations. No shortness of breath GI or GU concerns. He notes that his rashes seem to be worse in the winter  Past Medical History  Diagnosis Date  . Hypertension   . Hyperlipidemia   . 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 Fatty liver     mild  . Dermatitis 01/28/2013    Past Surgical History  Procedure Laterality Date  . Colonoscopy  2007  . Urinary diversion  2002    radical cystoprostatecomy and urinary diversion     Family History  Problem Relation Age of Onset  . Coronary artery disease Father   . Hypertension Mother   . Hypertension Father   . Diabetes Brother     borderline    History   Social History  . Marital Status: Married    Spouse Name: N/A    Number of Children: N/A  . Years of Education: N/A   Occupational History  . Not on file.   Social History Main Topics  . Smoking status: Never Smoker   . Smokeless tobacco: Never Used  . Alcohol Use: Yes  . Drug Use: No  . Sexually Active:  Not on file   Other Topics Concern  . 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      [employer]   Married for 32 year    one son 25    New grandchild   Alcohol use-yes (social)  < 14 drinks per week.    Current Outpatient Prescriptions on File Prior to Visit  Medication Sig Dispense Refill  . aspirin 81 MG tablet Take 81 mg by mouth daily.        . Cholecalciferol (VITAMIN D3) 1000 UNITS tablet Take 1,000 Units by mouth daily.        . Coenzyme Q10 (CO Q 10) 100 MG CAPS Take 100 mg by mouth daily.         No current facility-administered medications on file prior to visit.    No Known Allergies  Review of Systems  Review of Systems  Constitutional: Negative for fever and malaise/fatigue.  HENT: Negative for congestion and neck pain.   Eyes: Negative for discharge.  Respiratory: Negative for shortness of breath.   Cardiovascular: Negative for chest pain, palpitations and leg swelling.  Gastrointestinal: Negative for  nausea, abdominal pain and diarrhea.  Genitourinary: Negative for dysuria.  Musculoskeletal: Negative for falls.  Skin: Positive for itching and rash.       Right hand, left buttock  Neurological: Negative for loss of consciousness and headaches.  Endo/Heme/Allergies: Negative for polydipsia.  Psychiatric/Behavioral: Negative for depression and suicidal ideas. The patient is not nervous/anxious and does not have insomnia.     Objective  BP 138/86  Pulse 74  Temp(Src) 98.1 F (36.7 C) (Oral)  Ht 6\' 1"  (1.854 m)  Wt 246 lb 1.3 oz (111.621 kg)  BMI 32.47 kg/m2  SpO2 98%  Physical Exam  Physical Exam  Constitutional: He is oriented to person, place, and time and well-developed, well-nourished, and in no distress. No distress.  HENT:  Head: Normocephalic and atraumatic.  Eyes: Conjunctivae are normal.  Neck: Neck supple. No thyromegaly present.  Cardiovascular:  Normal rate, regular rhythm and normal heart sounds.   No murmur heard. Pulmonary/Chest: Effort normal and breath sounds normal. No respiratory distress.  Abdominal: Soft. Bowel sounds are normal. He exhibits no distension and no mass. There is no tenderness.  Musculoskeletal: He exhibits no edema.  Neurological: He is alert and oriented to person, place, and time.  Skin: Skin is warm. Rash noted. There is erythema.  Scaly, raised irritated lesion on right hand at webbing between 1st an 2nd digit  Psychiatric: Memory, affect and judgment normal.    Lab Results  Component Value Date   TSH 1.750 01/20/2011   Lab Results  Component Value Date   WBC 5.7 10/21/2011   HGB 15.8 10/21/2011   HCT 46.5 10/21/2011   MCV 89.3 10/21/2011   PLT 169 10/21/2011   Lab Results  Component Value Date   CREATININE 0.92 05/25/2012   BUN 23 05/25/2012   NA 137 05/25/2012   K 4.3 05/25/2012   CL 101 05/25/2012   CO2 27 05/25/2012   Lab Results  Component Value Date   ALT 17 05/25/2012   AST 16 05/25/2012   ALKPHOS 50 05/25/2012   BILITOT 0.7 05/25/2012   Lab Results  Component Value Date   CHOL 122 05/25/2012   Lab Results  Component Value Date   HDL 38* 05/25/2012   Lab Results  Component Value Date   LDLCALC 68 05/25/2012   Lab Results  Component Value Date   TRIG 80 05/25/2012   Lab Results  Component Value Date   CHOLHDL 3.2 05/25/2012     Assessment & Plan  HYPERGLYCEMIA Repeat hgba1c today, minimize simple carbs  Dermatitis Right hand lesion c/w eczema and a second lesion at gluteal cleft on left , start with Clobetasol cream qhs and report any concerns or worsening symptoms  DYSLIPIDEMIA Encouraged switch from fish oil to krill oil daily  HYPERTENSION Adequately controlled, encouraged DASH diet

## 2013-01-31 ENCOUNTER — Encounter: Payer: Self-pay | Admitting: *Deleted

## 2013-02-12 ENCOUNTER — Other Ambulatory Visit: Payer: Self-pay | Admitting: Internal Medicine

## 2013-06-07 ENCOUNTER — Other Ambulatory Visit: Payer: Self-pay | Admitting: Family Medicine

## 2013-08-13 ENCOUNTER — Telehealth: Payer: Self-pay

## 2013-08-13 DIAGNOSIS — E785 Hyperlipidemia, unspecified: Secondary | ICD-10-CM

## 2013-08-13 DIAGNOSIS — I10 Essential (primary) hypertension: Secondary | ICD-10-CM

## 2013-08-13 DIAGNOSIS — R7309 Other abnormal glucose: Secondary | ICD-10-CM

## 2013-08-13 LAB — BASIC METABOLIC PANEL
BUN: 21 mg/dL (ref 6–23)
CO2: 29 mEq/L (ref 19–32)
Calcium: 9.5 mg/dL (ref 8.4–10.5)
Creat: 0.92 mg/dL (ref 0.50–1.35)

## 2013-08-13 LAB — HEPATIC FUNCTION PANEL
AST: 20 U/L (ref 0–37)
Albumin: 4.5 g/dL (ref 3.5–5.2)
Alkaline Phosphatase: 38 U/L — ABNORMAL LOW (ref 39–117)
Indirect Bilirubin: 0.6 mg/dL (ref 0.0–0.9)
Total Bilirubin: 0.8 mg/dL (ref 0.3–1.2)

## 2013-08-13 LAB — LIPID PANEL: HDL: 54 mg/dL (ref >39)

## 2013-08-13 NOTE — Telephone Encounter (Signed)
Patient brought in paperwork for his work screening. I filled out paperwork and called and informed patient that it was from 05-2012 and his work was probably going to make him have more recent labs done. I informed patient that lab orders were placed in 6-13 for a year later.   Pt in today and brought form by to be completed after labs come back.

## 2013-08-14 ENCOUNTER — Encounter: Payer: Self-pay | Admitting: *Deleted

## 2013-08-19 ENCOUNTER — Telehealth: Payer: Self-pay

## 2013-08-19 NOTE — Telephone Encounter (Signed)
Left a message for patient to return my call. We are done with the Biometric Screening Form. Does patient want [place] to fax this or pt to pick this up?

## 2013-08-19 NOTE — Telephone Encounter (Signed)
Pt informed and states he will come by today to pick up the paperwork

## 2013-09-06 ENCOUNTER — Other Ambulatory Visit: Payer: Self-pay | Admitting: Family Medicine

## 2013-09-25 ENCOUNTER — Telehealth: Payer: Self-pay | Admitting: *Deleted

## 2013-09-25 MED ORDER — CLOBETASOL PROP EMOLLIENT BASE 0.05 % EX CREA: 1.0000 "application " | TOPICAL_CREAM | Freq: Two times a day (BID) | CUTANEOUS | Status: DC

## 2013-09-25 NOTE — Telephone Encounter (Signed)
rx sent

## 2013-09-25 NOTE — Telephone Encounter (Signed)
Pt left message requesting refill of medication that he was prescribed around 6 month ago for eczema on his back and buttock. Please advise.

## 2013-09-25 NOTE — Telephone Encounter (Signed)
Left message on voicemail that request has been completed and to call if any questions.

## 2013-12-04 ENCOUNTER — Other Ambulatory Visit: Payer: Self-pay | Admitting: Family

## 2014-01-31 ENCOUNTER — Other Ambulatory Visit: Payer: Self-pay | Admitting: Family Medicine

## 2014-01-31 NOTE — Telephone Encounter (Signed)
Patient overdue for CPE, had labs done in 09.2014; gave 30-day supply and forwarded to scheduler to contact pt and schedule CPE appointment/SLS **PATIENT MUST HAVE OFFICE VISIT PRIOR TO ANY FUTURE REFILLS**  Please call pt and schedule CPE appointment, last OV February 2014; must have visit prior to future refills/SLS Thanks.

## 2014-01-31 NOTE — Telephone Encounter (Signed)
Informed patient of medication refill and he scheduled appointment for 02/20/14

## 2014-02-20 ENCOUNTER — Ambulatory Visit (INDEPENDENT_AMBULATORY_CARE_PROVIDER_SITE_OTHER): Payer: 59 | Admitting: Physician Assistant

## 2014-02-20 ENCOUNTER — Encounter: Payer: Self-pay | Admitting: Physician Assistant

## 2014-02-20 VITALS — BP 124/80 | HR 73 | Temp 98.4°F | Resp 16 | Ht 73.0 in | Wt 241.0 lb

## 2014-02-20 DIAGNOSIS — Z0000 Encounter for general adult medical examination without abnormal findings: Secondary | ICD-10-CM

## 2014-02-20 DIAGNOSIS — Z7689 Persons encountering health services in other specified circumstances: Secondary | ICD-10-CM | POA: Insufficient documentation

## 2014-02-20 DIAGNOSIS — L309 Dermatitis, unspecified: Secondary | ICD-10-CM

## 2014-02-20 DIAGNOSIS — R32 Unspecified urinary incontinence: Secondary | ICD-10-CM

## 2014-02-20 DIAGNOSIS — E785 Hyperlipidemia, unspecified: Secondary | ICD-10-CM

## 2014-02-20 DIAGNOSIS — I10 Essential (primary) hypertension: Secondary | ICD-10-CM

## 2014-02-20 DIAGNOSIS — L259 Unspecified contact dermatitis, unspecified cause: Secondary | ICD-10-CM

## 2014-02-20 LAB — CBC WITH DIFFERENTIAL/PLATELET
Basophils Absolute: 0.1 10*3/uL (ref 0.0–0.1)
Basophils Relative: 1 % (ref 0–1)
EOS PCT: 3 % (ref 0–5)
Eosinophils Absolute: 0.2 10*3/uL (ref 0.0–0.7)
HCT: 47.6 % (ref 39.0–52.0)
Hemoglobin: 16.7 g/dL (ref 13.0–17.0)
LYMPHS ABS: 2 10*3/uL (ref 0.7–4.0)
LYMPHS PCT: 33 % (ref 12–46)
MCH: 30.3 pg (ref 26.0–34.0)
MCHC: 35.1 g/dL (ref 30.0–36.0)
MCV: 86.4 fL (ref 78.0–100.0)
MONO ABS: 0.7 10*3/uL (ref 0.1–1.0)
Monocytes Relative: 11 % (ref 3–12)
Neutro Abs: 3.1 10*3/uL (ref 1.7–7.7)
Neutrophils Relative %: 52 % (ref 43–77)
Platelets: 190 10*3/uL (ref 150–400)
RBC: 5.51 MIL/uL (ref 4.22–5.81)
RDW: 13 % (ref 11.5–15.5)
WBC: 6 10*3/uL (ref 4.0–10.5)

## 2014-02-20 LAB — BASIC METABOLIC PANEL
BUN: 21 mg/dL (ref 6–23)
CHLORIDE: 101 meq/L (ref 96–112)
CO2: 29 meq/L (ref 19–32)
CREATININE: 0.94 mg/dL (ref 0.50–1.35)
Calcium: 9 mg/dL (ref 8.4–10.5)
Glucose, Bld: 116 mg/dL — ABNORMAL HIGH (ref 70–99)
Potassium: 4.2 mEq/L (ref 3.5–5.3)
Sodium: 137 mEq/L (ref 135–145)

## 2014-02-20 LAB — TSH: TSH: 1.357 u[IU]/mL (ref 0.350–4.500)

## 2014-02-20 LAB — LIPID PANEL
CHOLESTEROL: 149 mg/dL (ref 0–200)
HDL: 52 mg/dL (ref >39)
LDL Cholesterol: 75 mg/dL (ref 0–99)
Total CHOL/HDL Ratio: 2.9 Ratio
Triglycerides: 111 mg/dL (ref <150)
VLDL: 22 mg/dL (ref 0–40)

## 2014-02-20 LAB — HEPATIC FUNCTION PANEL
ALT: 21 U/L (ref 0–53)
AST: 19 U/L (ref 0–37)
Albumin: 4.4 g/dL (ref 3.5–5.2)
Alkaline Phosphatase: 39 U/L (ref 39–117)
Bilirubin, Direct: 0.2 mg/dL (ref 0.0–0.3)
Indirect Bilirubin: 0.7 mg/dL (ref 0.2–1.2)
Total Bilirubin: 0.9 mg/dL (ref 0.2–1.2)
Total Protein: 6.7 g/dL (ref 6.0–8.3)

## 2014-02-20 NOTE — Assessment & Plan Note (Signed)
Medical history reviewed and updated. Will obtain fasting labs prior to [facility] visit.

## 2014-02-20 NOTE — Progress Notes (Signed)
Patient presents to clinic today to transfer care.  Acute Concerns: Incontinence -- Noticed over the past several months.  No associated with laughing, coughing, sneezing.  Does not feel urge prior to involuntary voiding. Patient has hx of bladder carcinoma in 2002. Patient had total cystectomy and prostatectomy.  Has a neobladder in place.  Does not have current Urology follow up. Was previously seen by [facility].  Denies urinary urgency, frequency, hematuria, N/V, or flank pain.  Patient requesting new referral to Dermatology in [place], [place].  Chronic Issues: Hypertension -- Well-controlled with Lisinopril-HCTZ.  Denies hx of CVA or MI.  Denies chest pain, palpitations, lightheadedness, SOB, headache or vision changes.  BP normotensive in clinic today.  Hyperlipidemia -- Currently on Zocor 40 mg daily.  Denies myalgias.  Due for fasting lipids.  Health Maintenance: Dental -- UTD Vision -- Overdue Immunizations -- Declines flu shot.  UTD on Tetanus.  Colonoscopy -- last in 2007; no abnormalities.  Due in 2017.  Past Medical History  Diagnosis Date  . Hypertension   . Hyperlipidemia   . 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 Fatty liver     mild  . Dermatitis 01/28/2013    Past Surgical History  Procedure Laterality Date  . Colonoscopy  2007  . Urinary diversion  2002    radical cystoprostatecomy and urinary diversion     Current Outpatient Prescriptions on File Prior to Visit  Medication Sig Dispense Refill  . aspirin 81 MG tablet Take 81 mg by mouth daily.        . Cholecalciferol (VITAMIN D3) 1000 UNITS tablet Take 1,000 Units by mouth daily.        . clobetasol cream (TEMOVATE) 0.05 % Apply topically daily as needed. To rash  60 g  2  . Clobetasol Prop Emollient Base 0.05 % emollient cream Apply 1 application topically 2 (two) times daily.  60  g  0  . Coenzyme Q10 (CO Q 10) 100 MG CAPS Take 100 mg by mouth daily.        Marland Kitchen lisinopril-hydrochlorothiazide (PRINZIDE,ZESTORETIC) 20-25 MG per tablet TAKE 1 TABLET BY MOUTH EVERY DAY.  90 tablet  0  . simvastatin (ZOCOR) 40 MG tablet TAKE 1 TABLET BY MOUTH AT BEDTIME  30 tablet  0   No current facility-administered medications on file prior to visit.    No Known Allergies  Family History  Problem Relation Age of Onset  . Coronary artery disease Father   . Hypertension Mother   . Hypertension Father   . Diabetes Brother     borderline    History   Social History  . Marital Status: Married    Spouse Name: N/A    Number of Children: N/A  . Years of Education: N/A   Occupational History  . Not on file.   Social History Main Topics  . Smoking status: Never Smoker   . Smokeless tobacco: Never Used  . Alcohol Use: 4.2 oz/week    7 Cans of beer per week  . Drug Use: No  . Sexual Activity: Not on file   Other Topics Concern  . 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      [place]   Married for 32 year  one son 59    New grandchild   Alcohol use-yes (social)  < 14 drinks per week.   Review of Systems  Constitutional: Negative for fever and weight loss.  HENT: Negative for ear pain, hearing loss and tinnitus.   Eyes: Negative for blurred vision, double vision, photophobia and pain.  Respiratory: Negative for cough, shortness of breath and wheezing.   Cardiovascular: Negative for chest pain and palpitations.  Gastrointestinal: Negative for heartburn, nausea, vomiting, abdominal pain, diarrhea, constipation, blood in stool and melena.  Genitourinary: Negative for urgency and frequency.       Some occasional urinary incontinence -- suspect overflow  Neurological: Negative for dizziness, loss of consciousness and headaches.  Endo/Heme/Allergies: Negative for environmental  allergies.  Psychiatric/Behavioral: Negative for depression, suicidal ideas, hallucinations and substance abuse. The patient is not nervous/anxious.    BP 124/80  Pulse 73  Temp(Src) 98.4 F (36.9 C) (Oral)  Resp 16  Ht 6\' 1"  (1.854 m)  Wt 241 lb (109.317 kg)  BMI 31.80 kg/m2  SpO2 98%  Physical Exam  Vitals reviewed. Constitutional: He is oriented to person, place, and time and well-developed, well-nourished, and in no distress.  HENT:  Head: Normocephalic and atraumatic.  Right Ear: External ear normal.  Left Ear: External ear normal.  Nose: Nose normal.  Mouth/Throat: Oropharynx is clear and moist. No oropharyngeal exudate.  TM within normal limits bilaterally.  Eyes: Conjunctivae are normal. Pupils are equal, round, and reactive to light.  Neck: Neck supple.  Cardiovascular: Normal rate, regular rhythm, normal heart sounds and intact distal pulses.   Pulmonary/Chest: Effort normal and breath sounds normal. No respiratory distress. He has no wheezes. He has no rales. He exhibits no tenderness.  Abdominal: Soft. Bowel sounds are normal. He exhibits no distension and no mass. There is no tenderness. There is no rebound and no guarding.  Lymphadenopathy:    He has no cervical adenopathy.  Neurological: He is alert and oriented to person, place, and time.  Skin: Skin is warm and dry. No rash noted.  Psychiatric: Affect normal.   Assessment/Plan: Encounter to establish care with new doctor Medical history reviewed and updated. Will obtain fasting labs prior to [facility] visit.  DYSLIPIDEMIA Repeat CMP and Lipid panel.  Dermatitis Referral to Dermatology placed per patient request.  HYPERTENSION BP normotensive.  Asymptomatic.  Continue current regimen.  Incontinence Urinary -- seems overflow.  Referral back to Urology giving extensive GU history. Will check BMP and UA.

## 2014-02-20 NOTE — Assessment & Plan Note (Signed)
Repeat CMP and Lipid panel.

## 2014-02-20 NOTE — Patient Instructions (Signed)
Please obtain labs.  I will call you with your results.  Continue medications as prescribed.  You will be contacted by Urology and Dermatology for an appointment.  Preventive Care for Adults, Male A healthy lifestyle and preventive care can promote health and wellness. Preventive health guidelines for men include the following key practices:  A routine yearly physical is a good way to check with your health care provider about your health and preventative screening. It is a chance to share any concerns and updates on your health and to receive a thorough exam.  Visit your dentist for a routine exam and preventative care every 6 months. Brush your teeth twice a day and floss once a day. Good oral hygiene prevents tooth decay and gum disease.  The frequency of eye exams is based on your age, health, family medical history, use of contact lenses, and other factors. Follow your health care provider's recommendations for frequency of eye exams.  Eat a healthy diet. Foods such as vegetables, fruits, whole grains, low-fat dairy products, and lean protein foods contain the nutrients you need without too many calories. Decrease your intake of foods high in solid fats, added sugars, and salt. Eat the right amount of calories for you.Get information about a proper diet from your health care provider, if necessary.  Regular physical exercise is one of the most important things you can do for your health. Most adults should get at least 150 minutes of moderate-intensity exercise (any activity that increases your heart rate and causes you to sweat) each week. In addition, most adults need muscle-strengthening exercises on 2 or more days a week.  Maintain a healthy weight. The body mass index (BMI) is a screening tool to identify possible weight problems. It provides an estimate of body fat based on height and weight. Your health care provider can find your BMI and can help you achieve or maintain a healthy  weight.For adults 20 years and older:  A BMI below 18.5 is considered underweight.  A BMI of 18.5 to 24.9 is normal.  A BMI of 25 to 29.9 is considered overweight.  A BMI of 30 and above is considered obese.  Maintain normal blood lipids and cholesterol levels by exercising and minimizing your intake of saturated fat. Eat a balanced diet with plenty of fruit and vegetables. Blood tests for lipids and cholesterol should begin at age 57 and be repeated every 5 years. If your lipid or cholesterol levels are high, you are over 50, or you are at high risk for heart disease, you may need your cholesterol levels checked more frequently.Ongoing high lipid and cholesterol levels should be treated with medicines if diet and exercise are not working.  If you smoke, find out from your health care provider how to quit. If you do not use tobacco, do not start.  Lung cancer screening is recommended for adults aged 52 80 years who are at high risk for developing lung cancer because of a history of smoking. A yearly low-dose CT scan of the lungs is recommended for people who have at least a 30-pack-year history of smoking and are a current smoker or have quit within the past 15 years. A pack year of smoking is smoking an average of 1 pack of cigarettes a day for 1 year (for example: 1 pack a day for 30 years or 2 packs a day for 15 years). Yearly screening should continue until the smoker has stopped smoking for at least 15 years. Yearly screening  should be stopped for people who develop a health problem that would prevent them from having lung cancer treatment.  If you choose to drink alcohol, do not have more than 2 drinks per day. One drink is considered to be 12 ounces (355 mL) of beer, 5 ounces (148 mL) of wine, or 1.5 ounces (44 mL) of liquor.  Avoid use of street drugs. Do not share needles with anyone. Ask for help if you need support or instructions about stopping the use of drugs.  High blood pressure  causes heart disease and increases the risk of stroke. Your blood pressure should be checked at least every 1 2 years. Ongoing high blood pressure should be treated with medicines, if weight loss and exercise are not effective.  If you are 41 64 years old, ask your health care provider if you should take aspirin to prevent heart disease.  Diabetes screening involves taking a blood sample to check your fasting blood sugar level. This should be done once every 3 years, after age 70, if you are within normal weight and without risk factors for diabetes. Testing should be considered at a younger age or be carried out more frequently if you are overweight and have at least 1 risk factor for diabetes.  Colorectal cancer can be detected and often prevented. Most routine colorectal cancer screening begins at the age of 74 and continues through age 26. However, your health care provider may recommend screening at an earlier age if you have risk factors for colon cancer. On a yearly basis, your health care provider may provide home test kits to check for hidden blood in the stool. Use of a small camera at the end of a tube to directly examine the colon (sigmoidoscopy or colonoscopy) can detect the earliest forms of colorectal cancer. Talk to your health care provider about this at age 5, when routine screening begins. Direct exam of the colon should be repeated every 5 10 years through age 70, unless early forms of precancerous polyps or small growths are found.  People who are at an increased risk for hepatitis B should be screened for this virus. You are considered at high risk for hepatitis B if:  You were born in a country where hepatitis B occurs often. Talk with your health care provider about which countries are considered high-risk.  Your parents were born in a high-risk country and you have not received a shot to protect against hepatitis B (hepatitis B vaccine).  You have HIV or AIDS.  You use  needles to inject street drugs.  You live with, or have sex with, someone who has hepatitis B.  You are a man who has sex with other men (MSM).  You get hemodialysis treatment.  You take certain medicines for conditions such as cancer, organ transplantation, and autoimmune conditions.  Hepatitis C blood testing is recommended for all people born from [birth year] through 1965 and any individual with known risks for hepatitis C.  Practice safe sex. Use condoms and avoid high-risk sexual practices to reduce the spread of sexually transmitted infections (STIs). STIs include gonorrhea, chlamydia, syphilis, trichomonas, herpes, HPV, and human immunodeficiency virus (HIV). Herpes, HIV, and HPV are viral illnesses that have no cure. They can result in disability, cancer, and death.  A one-time screening for abdominal aortic aneurysm (AAA) and surgical repair of large AAAs by ultrasound are recommended for men ages 81 to 1 years who are current or former smokers.  Healthy men should no longer  receive prostate-specific antigen (PSA) blood tests as part of routine cancer screening. Talk with your health care provider about prostate cancer screening.  Testicular cancer screening is not recommended for adult males who have no symptoms. Screening includes self-exam, a health care provider exam, and other screening tests. Consult with your health care provider about any symptoms you have or any concerns you have about testicular cancer.  Use sunscreen. Apply sunscreen liberally and repeatedly throughout the day. You should seek shade when your shadow is shorter than you. Protect yourself by wearing long sleeves, pants, a wide-brimmed hat, and sunglasses year round, whenever you are outdoors.  Once a month, do a whole-body skin exam, using a mirror to look at the skin on your back. Tell your health care provider about new moles, moles that have irregular borders, moles that are larger than a pencil eraser, or moles  that have changed in shape or color.  Stay current with required vaccines (immunizations).  Influenza vaccine. All adults should be immunized every year.  Tetanus, diphtheria, and acellular pertussis (Td, Tdap) vaccine. An adult who has not previously received Tdap or who does not know his vaccine status should receive 1 dose of Tdap. This initial dose should be followed by tetanus and diphtheria toxoids (Td) booster doses every 10 years. Adults with an unknown or incomplete history of completing a 3-dose immunization series with Td-containing vaccines should begin or complete a primary immunization series including a Tdap dose. Adults should receive a Td booster every 10 years.  Varicella vaccine. An adult without evidence of immunity to varicella should receive 2 doses or a second dose if he has previously received 1 dose.  Human papillomavirus (HPV) vaccine. Males aged 15 21 years who have not received the vaccine previously should receive the 3-dose series. Males aged 28 26 years may be immunized. Immunization is recommended through the age of 37 years for any male who has sex with males and did not get any or all doses earlier. Immunization is recommended for any person with an immunocompromised condition through the age of 26 years if he did not get any or all doses earlier. During the 3-dose series, the second dose should be obtained 4 8 weeks after the first dose. The third dose should be obtained 24 weeks after the first dose and 16 weeks after the second dose.  Zoster vaccine. One dose is recommended for adults aged 39 years or older unless certain conditions are present.  Measles, mumps, and rubella (MMR) vaccine. Adults born before [birth year] generally are considered immune to measles and mumps. Adults born in [birth year] or later should have 1 or more doses of MMR vaccine unless there is a contraindication to the vaccine or there is laboratory evidence of immunity to each of the three diseases. A  routine second dose of MMR vaccine should be obtained at least 28 days after the first dose for students attending postsecondary schools, health care workers, or international travelers. People who received inactivated measles vaccine or an unknown type of measles vaccine during 1963 1967 should receive 2 doses of MMR vaccine. People who received inactivated mumps vaccine or an unknown type of mumps vaccine before 1979 and are at high risk for mumps infection should consider immunization with 2 doses of MMR vaccine. Unvaccinated health care workers born before [birth year] who lack laboratory evidence of measles, mumps, or rubella immunity or laboratory confirmation of disease should consider measles and mumps immunization with 2 doses of MMR vaccine or rubella immunization  with 1 dose of MMR vaccine.  Pneumococcal 13-valent conjugate (PCV13) vaccine. When indicated, a person who is uncertain of his immunization history and has no record of immunization should receive the PCV13 vaccine. An adult aged 30 years or older who has certain medical conditions and has not been previously immunized should receive 1 dose of PCV13 vaccine. This PCV13 should be followed with a dose of pneumococcal polysaccharide (PPSV23) vaccine. The PPSV23 vaccine dose should be obtained at least 8 weeks after the dose of PCV13 vaccine. An adult aged 66 years or older who has certain medical conditions and previously received 1 or more doses of PPSV23 vaccine should receive 1 dose of PCV13. The PCV13 vaccine dose should be obtained 1 or more years after the last PPSV23 vaccine dose.  Pneumococcal polysaccharide (PPSV23) vaccine. When PCV13 is also indicated, PCV13 should be obtained first. All adults aged 12 years and older should be immunized. An adult younger than age 61 years who has certain medical conditions should be immunized. Any person who resides in a nursing home or long-term care facility should be immunized. An adult smoker should be  immunized. People with an immunocompromised condition and certain other conditions should receive both PCV13 and PPSV23 vaccines. People with human immunodeficiency virus (HIV) infection should be immunized as soon as possible after diagnosis. Immunization during chemotherapy or radiation therapy should be avoided. Routine use of PPSV23 vaccine is not recommended for American Indians, [place] Natives, or people younger than 65 years unless there are medical conditions that require PPSV23 vaccine. When indicated, people who have unknown immunization and have no record of immunization should receive PPSV23 vaccine. One-time revaccination 5 years after the first dose of PPSV23 is recommended for people aged 39 64 years who have chronic kidney failure, nephrotic syndrome, asplenia, or immunocompromised conditions. People who received 1 2 doses of PPSV23 before age 12 years should receive another dose of PPSV23 vaccine at age 41 years or later if at least 5 years have passed since the previous dose. Doses of PPSV23 are not needed for people immunized with PPSV23 at or after age 39 years.  Meningococcal vaccine. Adults with asplenia or persistent complement component deficiencies should receive 2 doses of quadrivalent meningococcal conjugate (MenACWY-D) vaccine. The doses should be obtained at least 2 months apart. Microbiologists working with certain meningococcal bacteria, [employer] recruits, people at risk during an outbreak, and people who travel to or live in countries with a high rate of meningitis should be immunized. A first-year college student up through age 40 years who is living in a residence hall should receive a dose if he did not receive a dose on or after his 16th birthday. Adults who have certain high-risk conditions should receive one or more doses of vaccine.  Hepatitis A vaccine. Adults who wish to be protected from this disease, have certain high-risk conditions, work with hepatitis A-infected  animals, work in hepatitis A research labs, or travel to or work in countries with a high rate of hepatitis A should be immunized. Adults who were previously unvaccinated and who anticipate close contact with an international adoptee during the first 60 days after arrival in the [place] from a country with a high rate of hepatitis A should be immunized.  Hepatitis B vaccine. Adults who wish to be protected from this disease, have certain high-risk conditions, may be exposed to blood or other infectious body fluids, are household contacts or sex partners of hepatitis B positive people, are clients or workers  in certain care facilities, or travel to or work in countries with a high rate of hepatitis B should be immunized.  Haemophilus influenzae type b (Hib) vaccine. A previously unvaccinated person with asplenia or sickle cell disease or having a scheduled splenectomy should receive 1 dose of Hib vaccine. Regardless of previous immunization, a recipient of a hematopoietic stem cell transplant should receive a 3-dose series 6 12 months after his successful transplant. Hib vaccine is not recommended for adults with HIV infection. Preventive Service / Frequency Ages 26 to 40  Blood pressure check.** / Every 1 to 2 years.  Lipid and cholesterol check.** / Every 5 years beginning at age 31.  Hepatitis C blood test.** / For any individual with known risks for hepatitis C.  Skin self-exam. / Monthly.  Influenza vaccine. / Every year.  Tetanus, diphtheria, and acellular pertussis (Tdap, Td) vaccine.** / Consult your health care provider. 1 dose of Td every 10 years.  Varicella vaccine.** / Consult your health care provider.  HPV vaccine. / 3 doses over 6 months, if 78 or younger.  Measles, mumps, rubella (MMR) vaccine.** / You need at least 1 dose of MMR if you were born in 1957 or later. You may also need a second dose.  Pneumococcal 13-valent conjugate (PCV13) vaccine.** / Consult your  health care provider.  Pneumococcal polysaccharide (PPSV23) vaccine.** / 1 to 2 doses if you smoke cigarettes or if you have certain conditions.  Meningococcal vaccine.** / 1 dose if you are age 36 to 65 years and a Market researcher living in a residence hall, or have one of several medical conditions. You may also need additional booster doses.  Hepatitis A vaccine.** / Consult your health care provider.  Hepatitis B vaccine.** / Consult your health care provider.  Haemophilus influenzae type b (Hib) vaccine.** / Consult your health care provider. Ages 59 to 43  Blood pressure check.** / Every 1 to 2 years.  Lipid and cholesterol check.** / Every 5 years beginning at age 64.  Lung cancer screening. / Every year if you are aged 24 80 years and have a 30-pack-year history of smoking and currently smoke or have quit within the past 15 years. Yearly screening is stopped once you have quit smoking for at least 15 years or develop a health problem that would prevent you from having lung cancer treatment.  Fecal occult blood test (FOBT) of stool. / Every year beginning at age 76 and continuing until age 1. You may not have to do this test if you get a colonoscopy every 10 years.  Flexible sigmoidoscopy** or colonoscopy.** / Every 5 years for a flexible sigmoidoscopy or every 10 years for a colonoscopy beginning at age 62 and continuing until age 27.  Hepatitis C blood test.** / For all people born from [birth year] through 1965 and any individual with known risks for hepatitis C.  Skin self-exam. / Monthly.  Influenza vaccine. / Every year.  Tetanus, diphtheria, and acellular pertussis (Tdap/Td) vaccine.** / Consult your health care provider. 1 dose of Td every 10 years.  Varicella vaccine.** / Consult your health care provider.  Zoster vaccine.** / 1 dose for adults aged 16 years or older.  Measles, mumps, rubella (MMR) vaccine.** / You need at least 1 dose of MMR if you were born in  1958 or later. You may also need a second dose.  Pneumococcal 13-valent conjugate (PCV13) vaccine.** / Consult your health care provider.  Pneumococcal polysaccharide (PPSV23) vaccine.** / 1 to 2  doses if you smoke cigarettes or if you have certain conditions.  Meningococcal vaccine.** / Consult your health care provider.  Hepatitis A vaccine.** / Consult your health care provider.  Hepatitis B vaccine.** / Consult your health care provider.  Haemophilus influenzae type b (Hib) vaccine.** / Consult your health care provider. Ages 35 and over  Blood pressure check.** / Every 1 to 2 years.  Lipid and cholesterol check.**/ Every 5 years beginning at age 67.  Lung cancer screening. / Every year if you are aged 80 80 years and have a 30-pack-year history of smoking and currently smoke or have quit within the past 15 years. Yearly screening is stopped once you have quit smoking for at least 15 years or develop a health problem that would prevent you from having lung cancer treatment.  Fecal occult blood test (FOBT) of stool. / Every year beginning at age 59 and continuing until age 43. You may not have to do this test if you get a colonoscopy every 10 years.  Flexible sigmoidoscopy** or colonoscopy.** / Every 5 years for a flexible sigmoidoscopy or every 10 years for a colonoscopy beginning at age 87 and continuing until age 68.  Hepatitis C blood test.** / For all people born from [birth year] through 1966 and any individual with known risks for hepatitis C.  Abdominal aortic aneurysm (AAA) screening.** / A one-time screening for ages 59 to 9 years who are current or former smokers.  Skin self-exam. / Monthly.  Influenza vaccine. / Every year.  Tetanus, diphtheria, and acellular pertussis (Tdap/Td) vaccine.** / 1 dose of Td every 10 years.  Varicella vaccine.** / Consult your health care provider.  Zoster vaccine.** / 1 dose for adults aged 41 years or older.  Pneumococcal 13-valent  conjugate (PCV13) vaccine.** / Consult your health care provider.  Pneumococcal polysaccharide (PPSV23) vaccine.** / 1 dose for all adults aged 4 years and older.  Meningococcal vaccine.** / Consult your health care provider.  Hepatitis A vaccine.** / Consult your health care provider.  Hepatitis B vaccine.** / Consult your health care provider.  Haemophilus influenzae type b (Hib) vaccine.** / Consult your health care provider. **Family history and personal history of risk and conditions may change your health care provider's recommendations. Document Released: 01/17/2002 Document Revised: 09/11/2013 Document Reviewed: 04/18/2011 [facility] Patient Information 2014 [place], [place].

## 2014-02-20 NOTE — Assessment & Plan Note (Signed)
BP normotensive. Asymptomatic. Continue current regimen.  

## 2014-02-20 NOTE — Assessment & Plan Note (Signed)
Referral to Dermatology placed per patient request.

## 2014-02-20 NOTE — Assessment & Plan Note (Signed)
Urinary -- seems overflow.  Referral back to Urology giving extensive GU history. Will check BMP and UA.

## 2014-02-20 NOTE — Progress Notes (Signed)
Pre visit review using our clinic review tool, if applicable. No additional management support is needed unless otherwise documented below in the visit note/SLS  

## 2014-02-21 ENCOUNTER — Telehealth: Payer: Self-pay | Admitting: Physician Assistant

## 2014-02-21 LAB — URINALYSIS, ROUTINE W REFLEX MICROSCOPIC
BILIRUBIN URINE: NEGATIVE
GLUCOSE, UA: NEGATIVE mg/dL
Hgb urine dipstick: NEGATIVE
Ketones, ur: NEGATIVE mg/dL
Leukocytes, UA: NEGATIVE
Nitrite: NEGATIVE
Protein, ur: NEGATIVE mg/dL
Specific Gravity, Urine: 1.012 (ref 1.005–1.030)
UROBILINOGEN UA: 0.2 mg/dL (ref 0.0–1.0)
pH: 7 (ref 5.0–8.0)

## 2014-02-21 NOTE — Telephone Encounter (Signed)
Relevant patient education assigned to patient using Emmi. ° °

## 2014-02-28 ENCOUNTER — Other Ambulatory Visit: Payer: Self-pay | Admitting: Physician Assistant

## 2014-02-28 NOTE — Telephone Encounter (Signed)
Rx request to pharmacy/SLS  

## 2014-03-10 ENCOUNTER — Other Ambulatory Visit: Payer: Self-pay | Admitting: Family Medicine

## 2014-05-19 ENCOUNTER — Encounter: Payer: Self-pay | Admitting: Internal Medicine

## 2014-06-09 ENCOUNTER — Other Ambulatory Visit: Payer: Self-pay | Admitting: Physician Assistant

## 2014-07-06 ENCOUNTER — Other Ambulatory Visit: Payer: Self-pay | Admitting: Family Medicine

## 2014-07-31 ENCOUNTER — Telehealth: Payer: Self-pay | Admitting: Physician Assistant

## 2014-07-31 MED ORDER — SIMVASTATIN 40 MG PO TABS: ORAL_TABLET | ORAL | Status: DC

## 2014-07-31 NOTE — Telephone Encounter (Signed)
Requesting refills on Lisinopril-HCTZ and Simvastatin, [facility]

## 2014-07-31 NOTE — Telephone Encounter (Signed)
Medication Detail      Disp Refills Start End     lisinopril-hydrochlorothiazide (PRINZIDE,ZESTORETIC) 20-25 MG per tablet 90 tablet 0 06/09/2014     Sig: TAKE 1 TABLET BY MOUTH DAILY    E-Prescribing Status: Receipt confirmed by pharmacy (06/09/2014 10:40 AM EDT)    Pharmacy    [id number] - [place], [place] - [address]   Medication Detail      Disp Refills Start End     simvastatin (ZOCOR) 40 MG tablet 30 tablet 0      Sig: TAKE 1 TABLET BY MOUTH EVERY EVENING AT BEDTIME    E-Prescribing Status: Receipt confirmed by pharmacy (07/07/2014 10:11 PM EDT)     Simvastatin sent to mail order pharmacy; Lisinopril-HCTZ not due until 10.2015; will Hold until due/SLS

## 2014-08-04 ENCOUNTER — Other Ambulatory Visit: Payer: Self-pay | Admitting: *Deleted

## 2014-08-04 MED ORDER — LISINOPRIL-HYDROCHLOROTHIAZIDE 20-25 MG PO TABS: ORAL_TABLET | ORAL | Status: DC

## 2014-09-01 NOTE — Telephone Encounter (Signed)
Medication sent early by another medical assistant on 08.31.15/SLS

## 2014-09-23 ENCOUNTER — Other Ambulatory Visit: Payer: Self-pay | Admitting: Physician Assistant

## 2014-09-23 NOTE — Telephone Encounter (Signed)
Medication Detail      Disp Refills Start End     simvastatin (ZOCOR) 40 MG tablet 90 tablet 0 07/31/2014     Sig: TAKE 1 TABLET BY MOUTH EVERY EVENING AT BEDTIME    E-Prescribing Status: Receipt confirmed by pharmacy (07/31/2014 9:08 AM EDT)    Pharmacy    [facility]   Rx not due until October 29, 2014-Too Soon for request/SLS

## 2014-11-05 ENCOUNTER — Other Ambulatory Visit: Payer: Self-pay | Admitting: Physician Assistant

## 2014-12-29 ENCOUNTER — Other Ambulatory Visit: Payer: Self-pay | Admitting: Physician Assistant

## 2014-12-29 NOTE — Telephone Encounter (Signed)
Medication Detail      Disp Refills Start End     lisinopril-hydrochlorothiazide (PRINZIDE,ZESTORETIC) 20-25 MG per tablet 90 tablet 1 08/04/2014     Sig: TAKE 1 TABLET BY MOUTH DAILY    E-Prescribing Status: Receipt confirmed by pharmacy (08/04/2014 1:30 PM EDT)     Pharmacy    [place], [place]   Rx request to pharmacy; post date to fill on or after 02.25.16/SLS

## 2015-02-17 ENCOUNTER — Telehealth: Payer: Self-pay | Admitting: Physician Assistant

## 2015-02-17 MED ORDER — LISINOPRIL-HYDROCHLOROTHIAZIDE 20-25 MG PO TABS: ORAL_TABLET | ORAL | Status: DC

## 2015-02-17 MED ORDER — SIMVASTATIN 40 MG PO TABS: ORAL_TABLET | ORAL | Status: DC

## 2015-02-17 NOTE — Telephone Encounter (Signed)
Caller name: Marlos Relation to pt: self Call back number: [phone] Pharmacy:  Reason for call:   Needs all medications sent to [place]. Patient only has a week of medication left

## 2015-02-17 NOTE — Telephone Encounter (Signed)
Rx request to pharmacy/SLS **Requested drug refills are authorized, however, the patient needs further evaluation and/or laboratory testing before further refills are given. Ask him to make an appointment for this**  Please call patient and arrange OV prior to future refill authorizations/SLS Thanks.

## 2015-02-23 ENCOUNTER — Telehealth: Payer: Self-pay

## 2015-02-23 NOTE — Telephone Encounter (Signed)
See speciality notes 

## 2015-02-24 ENCOUNTER — Ambulatory Visit (INDEPENDENT_AMBULATORY_CARE_PROVIDER_SITE_OTHER): Payer: Medicare Other | Admitting: Physician Assistant

## 2015-02-24 ENCOUNTER — Telehealth: Payer: Self-pay | Admitting: Physician Assistant

## 2015-02-24 ENCOUNTER — Encounter: Payer: Self-pay | Admitting: Physician Assistant

## 2015-02-24 VITALS — BP 130/82 | HR 64 | Temp 98.5°F | Resp 16 | Ht 73.0 in | Wt 248.0 lb

## 2015-02-24 DIAGNOSIS — Z136 Encounter for screening for cardiovascular disorders: Secondary | ICD-10-CM

## 2015-02-24 DIAGNOSIS — R399 Unspecified symptoms and signs involving the genitourinary system: Secondary | ICD-10-CM | POA: Diagnosis not present

## 2015-02-24 DIAGNOSIS — L309 Dermatitis, unspecified: Secondary | ICD-10-CM | POA: Diagnosis not present

## 2015-02-24 DIAGNOSIS — Z125 Encounter for screening for malignant neoplasm of prostate: Secondary | ICD-10-CM

## 2015-02-24 DIAGNOSIS — Z23 Encounter for immunization: Secondary | ICD-10-CM | POA: Diagnosis not present

## 2015-02-24 DIAGNOSIS — Z0000 Encounter for general adult medical examination without abnormal findings: Secondary | ICD-10-CM

## 2015-02-24 DIAGNOSIS — R739 Hyperglycemia, unspecified: Secondary | ICD-10-CM

## 2015-02-24 DIAGNOSIS — I10 Essential (primary) hypertension: Secondary | ICD-10-CM

## 2015-02-24 DIAGNOSIS — N3000 Acute cystitis without hematuria: Secondary | ICD-10-CM

## 2015-02-24 DIAGNOSIS — E785 Hyperlipidemia, unspecified: Secondary | ICD-10-CM

## 2015-02-24 LAB — URINALYSIS, ROUTINE W REFLEX MICROSCOPIC
Bilirubin Urine: NEGATIVE
Ketones, ur: NEGATIVE
NITRITE: POSITIVE — AB
SPECIFIC GRAVITY, URINE: 1.01 (ref 1.000–1.030)
Total Protein, Urine: NEGATIVE
UROBILINOGEN UA: 0.2 (ref 0.0–1.0)
Urine Glucose: NEGATIVE
pH: 6.5 (ref 5.0–8.0)

## 2015-02-24 LAB — CBC
HCT: 45 % (ref 39.0–52.0)
Hemoglobin: 15.3 g/dL (ref 13.0–17.0)
MCHC: 34 g/dL (ref 30.0–36.0)
MCV: 88.5 fl (ref 78.0–100.0)
PLATELETS: 184 10*3/uL (ref 150.0–400.0)
RBC: 5.09 Mil/uL (ref 4.22–5.81)
RDW: 13 % (ref 11.5–15.5)
WBC: 5.7 10*3/uL (ref 4.0–10.5)

## 2015-02-24 LAB — COMPREHENSIVE METABOLIC PANEL
ALT: 17 U/L (ref 0–53)
AST: 16 U/L (ref 0–37)
Albumin: 4.1 g/dL (ref 3.5–5.2)
Alkaline Phosphatase: 39 U/L (ref 39–117)
BUN: 24 mg/dL — ABNORMAL HIGH (ref 6–23)
CALCIUM: 9 mg/dL (ref 8.4–10.5)
CHLORIDE: 107 meq/L (ref 96–112)
CO2: 28 mEq/L (ref 19–32)
Creatinine, Ser: 1.05 mg/dL (ref 0.40–1.50)
GFR: 75.31 mL/min (ref >60.00)
Glucose, Bld: 105 mg/dL — ABNORMAL HIGH (ref 70–99)
Potassium: 4.3 mEq/L (ref 3.5–5.1)
Sodium: 139 mEq/L (ref 135–145)
Total Bilirubin: 0.4 mg/dL (ref 0.2–1.2)
Total Protein: 6.5 g/dL (ref 6.0–8.3)

## 2015-02-24 LAB — LIPID PANEL
Cholesterol: 196 mg/dL (ref 0–200)
HDL: 46.9 mg/dL (ref >39.00)
LDL CALC: 119 mg/dL — AB (ref 0–99)
NONHDL: 149.1
Total CHOL/HDL Ratio: 4
Triglycerides: 151 mg/dL — ABNORMAL HIGH (ref 0.0–149.0)
VLDL: 30.2 mg/dL (ref 0.0–40.0)

## 2015-02-24 LAB — PSA: PSA: 0 ng/mL — ABNORMAL LOW (ref 0.10–4.00)

## 2015-02-24 LAB — HEMOGLOBIN A1C: Hgb A1c MFr Bld: 6 % (ref 4.6–6.5)

## 2015-02-24 MED ORDER — SIMVASTATIN 40 MG PO TABS: ORAL_TABLET | ORAL | Status: DC

## 2015-02-24 MED ORDER — CIPROFLOXACIN HCL 500 MG PO TABS: 500.0000 mg | ORAL_TABLET | Freq: Two times a day (BID) | ORAL | Status: DC

## 2015-02-24 MED ORDER — LISINOPRIL-HYDROCHLOROTHIAZIDE 20-25 MG PO TABS: ORAL_TABLET | ORAL | Status: DC

## 2015-02-24 MED ORDER — CLOBETASOL PROPIONATE 0.05 % EX CREA: TOPICAL_CREAM | Freq: Every day | CUTANEOUS | Status: DC | PRN

## 2015-02-24 NOTE — Patient Instructions (Signed)
Please continue your medications as directed. Stop by the lab for blood work.  I will call you with your results.   Preventive Care for Adults A healthy lifestyle and preventive care can promote health and wellness. Preventive health guidelines for men include the following key practices:  A routine yearly physical is a good way to check with your health care provider about your health and preventative screening. It is a chance to share any concerns and updates on your health and to receive a thorough exam.  Visit your dentist for a routine exam and preventative care every 6 months. Brush your teeth twice a day and floss once a day. Good oral hygiene prevents tooth decay and gum disease.  The frequency of eye exams is based on your age, health, family medical history, use of contact lenses, and other factors. Follow your health care provider's recommendations for frequency of eye exams.  Eat a healthy diet. Foods such as vegetables, fruits, whole grains, low-fat dairy products, and lean protein foods contain the nutrients you need without too many calories. Decrease your intake of foods high in solid fats, added sugars, and salt. Eat the right amount of calories for you.Get information about a proper diet from your health care provider, if necessary.  Regular physical exercise is one of the most important things you can do for your health. Most adults should get at least 150 minutes of moderate-intensity exercise (any activity that increases your heart rate and causes you to sweat) each week. In addition, most adults need muscle-strengthening exercises on 2 or more days a week.  Maintain a healthy weight. The body mass index (BMI) is a screening tool to identify possible weight problems. It provides an estimate of body fat based on height and weight. Your health care provider can find your BMI and can help you achieve or maintain a healthy weight.For adults 20 years and older:  A BMI below 18.5  is considered underweight.  A BMI of 18.5 to 24.9 is normal.  A BMI of 25 to 29.9 is considered overweight.  A BMI of 30 and above is considered obese.  Maintain normal blood lipids and cholesterol levels by exercising and minimizing your intake of saturated fat. Eat a balanced diet with plenty of fruit and vegetables. Blood tests for lipids and cholesterol should begin at age 71 and be repeated every 5 years. If your lipid or cholesterol levels are high, you are over 50, or you are at high risk for heart disease, you may need your cholesterol levels checked more frequently.Ongoing high lipid and cholesterol levels should be treated with medicines if diet and exercise are not working.  If you smoke, find out from your health care provider how to quit. If you do not use tobacco, do not start.  Lung cancer screening is recommended for adults aged 78-80 years who are at high risk for developing lung cancer because of a history of smoking. A yearly low-dose CT scan of the lungs is recommended for people who have at least a 30-pack-year history of smoking and are a current smoker or have quit within the past 15 years. A pack year of smoking is smoking an average of 1 pack of cigarettes a day for 1 year (for example: 1 pack a day for 30 years or 2 packs a day for 15 years). Yearly screening should continue until the smoker has stopped smoking for at least 15 years. Yearly screening should be stopped for people who develop a  health problem that would prevent them from having lung cancer treatment.  If you choose to drink alcohol, do not have more than 2 drinks per day. One drink is considered to be 12 ounces (355 mL) of beer, 5 ounces (148 mL) of wine, or 1.5 ounces (44 mL) of liquor.  Avoid use of street drugs. Do not share needles with anyone. Ask for help if you need support or instructions about stopping the use of drugs.  High blood pressure causes heart disease and increases the risk of stroke.  Your blood pressure should be checked at least every 1-2 years. Ongoing high blood pressure should be treated with medicines, if weight loss and exercise are not effective.  If you are 81-5 years old, ask your health care provider if you should take aspirin to prevent heart disease.  Diabetes screening involves taking a blood sample to check your fasting blood sugar level. This should be done once every 3 years, after age 24, if you are within normal weight and without risk factors for diabetes. Testing should be considered at a younger age or be carried out more frequently if you are overweight and have at least 1 risk factor for diabetes.  Colorectal cancer can be detected and often prevented. Most routine colorectal cancer screening begins at the age of 18 and continues through age 29. However, your health care provider may recommend screening at an earlier age if you have risk factors for colon cancer. On a yearly basis, your health care provider may provide home test kits to check for hidden blood in the stool. Use of a small camera at the end of a tube to directly examine the colon (sigmoidoscopy or colonoscopy) can detect the earliest forms of colorectal cancer. Talk to your health care provider about this at age 71, when routine screening begins. Direct exam of the colon should be repeated every 5-10 years through age 18, unless early forms of precancerous polyps or small growths are found.  People who are at an increased risk for hepatitis B should be screened for this virus. You are considered at high risk for hepatitis B if:  You were born in a country where hepatitis B occurs often. Talk with your health care provider about which countries are considered high risk.  Your parents were born in a high-risk country and you have not received a shot to protect against hepatitis B (hepatitis B vaccine).  You have HIV or AIDS.  You use needles to inject street drugs.  You live with, or have  sex with, someone who has hepatitis B.  You are a man who has sex with other men (MSM).  You get hemodialysis treatment.  You take certain medicines for conditions such as cancer, organ transplantation, and autoimmune conditions.  Hepatitis C blood testing is recommended for all people born from [birth year] through 1965 and any individual with known risks for hepatitis C.  Practice safe sex. Use condoms and avoid high-risk sexual practices to reduce the spread of sexually transmitted infections (STIs). STIs include gonorrhea, chlamydia, syphilis, trichomonas, herpes, HPV, and human immunodeficiency virus (HIV). Herpes, HIV, and HPV are viral illnesses that have no cure. They can result in disability, cancer, and death.  If you are at risk of being infected with HIV, it is recommended that you take a prescription medicine daily to prevent HIV infection. This is called preexposure prophylaxis (PrEP). You are considered at risk if:  You are a man who has sex with other men (  MSM) and have other risk factors.  You are a heterosexual man, are sexually active, and are at increased risk for HIV infection.  You take drugs by injection.  You are sexually active with a partner who has HIV.  Talk with your health care provider about whether you are at high risk of being infected with HIV. If you choose to begin PrEP, you should first be tested for HIV. You should then be tested every 3 months for as long as you are taking PrEP.  A one-time screening for abdominal aortic aneurysm (AAA) and surgical repair of large AAAs by ultrasound are recommended for men ages 51 to 3 years who are current or former smokers.  Healthy men should no longer receive prostate-specific antigen (PSA) blood tests as part of routine cancer screening. Talk with your health care provider about prostate cancer screening.  Testicular cancer screening is not recommended for adult males who have no symptoms. Screening includes self-exam,  a health care provider exam, and other screening tests. Consult with your health care provider about any symptoms you have or any concerns you have about testicular cancer.  Use sunscreen. Apply sunscreen liberally and repeatedly throughout the day. You should seek shade when your shadow is shorter than you. Protect yourself by wearing long sleeves, pants, a wide-brimmed hat, and sunglasses year round, whenever you are outdoors.  Once a month, do a whole-body skin exam, using a mirror to look at the skin on your back. Tell your health care provider about new moles, moles that have irregular borders, moles that are larger than a pencil eraser, or moles that have changed in shape or color.  Stay current with required vaccines (immunizations).  Influenza vaccine. All adults should be immunized every year.  Tetanus, diphtheria, and acellular pertussis (Td, Tdap) vaccine. An adult who has not previously received Tdap or who does not know his vaccine status should receive 1 dose of Tdap. This initial dose should be followed by tetanus and diphtheria toxoids (Td) booster doses every 10 years. Adults with an unknown or incomplete history of completing a 3-dose immunization series with Td-containing vaccines should begin or complete a primary immunization series including a Tdap dose. Adults should receive a Td booster every 10 years.  Varicella vaccine. An adult without evidence of immunity to varicella should receive 2 doses or a second dose if he has previously received 1 dose.  Human papillomavirus (HPV) vaccine. Males aged 41-21 years who have not received the vaccine previously should receive the 3-dose series. Males aged 22-26 years may be immunized. Immunization is recommended through the age of 47 years for any male who has sex with males and did not get any or all doses earlier. Immunization is recommended for any person with an immunocompromised condition through the age of 38 years if he did not get  any or all doses earlier. During the 3-dose series, the second dose should be obtained 4-8 weeks after the first dose. The third dose should be obtained 24 weeks after the first dose and 16 weeks after the second dose.  Zoster vaccine. One dose is recommended for adults aged 88 years or older unless certain conditions are present.  Measles, mumps, and rubella (MMR) vaccine. Adults born before [birth year] generally are considered immune to measles and mumps. Adults born in [birth year] or later should have 1 or more doses of MMR vaccine unless there is a contraindication to the vaccine or there is laboratory evidence of immunity to each of the  three diseases. A routine second dose of MMR vaccine should be obtained at least 28 days after the first dose for students attending postsecondary schools, health care workers, or international travelers. People who received inactivated measles vaccine or an unknown type of measles vaccine during 1963-1967 should receive 2 doses of MMR vaccine. People who received inactivated mumps vaccine or an unknown type of mumps vaccine before 1979 and are at high risk for mumps infection should consider immunization with 2 doses of MMR vaccine. Unvaccinated health care workers born before [birth year] who lack laboratory evidence of measles, mumps, or rubella immunity or laboratory confirmation of disease should consider measles and mumps immunization with 2 doses of MMR vaccine or rubella immunization with 1 dose of MMR vaccine.  Pneumococcal 13-valent conjugate (PCV13) vaccine. When indicated, a person who is uncertain of his immunization history and has no record of immunization should receive the PCV13 vaccine. An adult aged 59 years or older who has certain medical conditions and has not been previously immunized should receive 1 dose of PCV13 vaccine. This PCV13 should be followed with a dose of pneumococcal polysaccharide (PPSV23) vaccine. The PPSV23 vaccine dose should be obtained at least 8 weeks  after the dose of PCV13 vaccine. An adult aged 43 years or older who has certain medical conditions and previously received 1 or more doses of PPSV23 vaccine should receive 1 dose of PCV13. The PCV13 vaccine dose should be obtained 1 or more years after the last PPSV23 vaccine dose.  Pneumococcal polysaccharide (PPSV23) vaccine. When PCV13 is also indicated, PCV13 should be obtained first. All adults aged 52 years and older should be immunized. An adult younger than age 23 years who has certain medical conditions should be immunized. Any person who resides in a nursing home or long-term care facility should be immunized. An adult smoker should be immunized. People with an immunocompromised condition and certain other conditions should receive both PCV13 and PPSV23 vaccines. People with human immunodeficiency virus (HIV) infection should be immunized as soon as possible after diagnosis. Immunization during chemotherapy or radiation therapy should be avoided. Routine use of PPSV23 vaccine is not recommended for American Indians, [place] Natives, or people younger than 65 years unless there are medical conditions that require PPSV23 vaccine. When indicated, people who have unknown immunization and have no record of immunization should receive PPSV23 vaccine. One-time revaccination 5 years after the first dose of PPSV23 is recommended for people aged 19-64 years who have chronic kidney failure, nephrotic syndrome, asplenia, or immunocompromised conditions. People who received 1-2 doses of PPSV23 before age 8 years should receive another dose of PPSV23 vaccine at age 40 years or later if at least 5 years have passed since the previous dose. Doses of PPSV23 are not needed for people immunized with PPSV23 at or after age 21 years.  Meningococcal vaccine. Adults with asplenia or persistent complement component deficiencies should receive 2 doses of quadrivalent meningococcal conjugate (MenACWY-D) vaccine. The doses  should be obtained at least 2 months apart. Microbiologists working with certain meningococcal bacteria, [employer] recruits, people at risk during an outbreak, and people who travel to or live in countries with a high rate of meningitis should be immunized. A first-year college student up through age 46 years who is living in a residence hall should receive a dose if he did not receive a dose on or after his 16th birthday. Adults who have certain high-risk conditions should receive one or more doses of vaccine.  Hepatitis A vaccine. Adults  who wish to be protected from this disease, have certain high-risk conditions, work with hepatitis A-infected animals, work in hepatitis A research labs, or travel to or work in countries with a high rate of hepatitis A should be immunized. Adults who were previously unvaccinated and who anticipate close contact with an international adoptee during the first 60 days after arrival in the [place] from a country with a high rate of hepatitis A should be immunized.  Hepatitis B vaccine. Adults should be immunized if they wish to be protected from this disease, have certain high-risk conditions, may be exposed to blood or other infectious body fluids, are household contacts or sex partners of hepatitis B positive people, are clients or workers in certain care facilities, or travel to or work in countries with a high rate of hepatitis B.  Haemophilus influenzae type b (Hib) vaccine. A previously unvaccinated person with asplenia or sickle cell disease or having a scheduled splenectomy should receive 1 dose of Hib vaccine. Regardless of previous immunization, a recipient of a hematopoietic stem cell transplant should receive a 3-dose series 6-12 months after his successful transplant. Hib vaccine is not recommended for adults with HIV infection. Preventive Service / Frequency Ages 30 to 61  Blood pressure check.** / Every 1 to 2 years.  Lipid and cholesterol check.** /  Every 5 years beginning at age 32.  Hepatitis C blood test.** / For any individual with known risks for hepatitis C.  Skin self-exam. / Monthly.  Influenza vaccine. / Every year.  Tetanus, diphtheria, and acellular pertussis (Tdap, Td) vaccine.** / Consult your health care provider. 1 dose of Td every 10 years.  Varicella vaccine.** / Consult your health care provider.  HPV vaccine. / 3 doses over 6 months, if 80 or younger.  Measles, mumps, rubella (MMR) vaccine.** / You need at least 1 dose of MMR if you were born in 1957 or later. You may also need a second dose.  Pneumococcal 13-valent conjugate (PCV13) vaccine.** / Consult your health care provider.  Pneumococcal polysaccharide (PPSV23) vaccine.** / 1 to 2 doses if you smoke cigarettes or if you have certain conditions.  Meningococcal vaccine.** / 1 dose if you are age 42 to 56 years and a Market researcher living in a residence hall, or have one of several medical conditions. You may also need additional booster doses.  Hepatitis A vaccine.** / Consult your health care provider.  Hepatitis B vaccine.** / Consult your health care provider.  Haemophilus influenzae type b (Hib) vaccine.** / Consult your health care provider. Ages 49 to 18  Blood pressure check.** / Every 1 to 2 years.  Lipid and cholesterol check.** / Every 5 years beginning at age 74.  Lung cancer screening. / Every year if you are aged 24-80 years and have a 30-pack-year history of smoking and currently smoke or have quit within the past 15 years. Yearly screening is stopped once you have quit smoking for at least 15 years or develop a health problem that would prevent you from having lung cancer treatment.  Fecal occult blood test (FOBT) of stool. / Every year beginning at age 82 and continuing until age 29. You may not have to do this test if you get a colonoscopy every 10 years.  Flexible sigmoidoscopy** or colonoscopy.** / Every 5 years for a  flexible sigmoidoscopy or every 10 years for a colonoscopy beginning at age 19 and continuing until age 29.  Hepatitis C blood test.** / For all people  born from [birth year] through 1965 and any individual with known risks for hepatitis C.  Skin self-exam. / Monthly.  Influenza vaccine. / Every year.  Tetanus, diphtheria, and acellular pertussis (Tdap/Td) vaccine.** / Consult your health care provider. 1 dose of Td every 10 years.  Varicella vaccine.** / Consult your health care provider.  Zoster vaccine.** / 1 dose for adults aged 8 years or older.  Measles, mumps, rubella (MMR) vaccine.** / You need at least 1 dose of MMR if you were born in 1957 or later. You may also need a second dose.  Pneumococcal 13-valent conjugate (PCV13) vaccine.** / Consult your health care provider.  Pneumococcal polysaccharide (PPSV23) vaccine.** / 1 to 2 doses if you smoke cigarettes or if you have certain conditions.  Meningococcal vaccine.** / Consult your health care provider.  Hepatitis A vaccine.** / Consult your health care provider.  Hepatitis B vaccine.** / Consult your health care provider.  Haemophilus influenzae type b (Hib) vaccine.** / Consult your health care provider. Ages 5 and over  Blood pressure check.** / Every 1 to 2 years.  Lipid and cholesterol check.**/ Every 5 years beginning at age 50.  Lung cancer screening. / Every year if you are aged 6-80 years and have a 30-pack-year history of smoking and currently smoke or have quit within the past 15 years. Yearly screening is stopped once you have quit smoking for at least 15 years or develop a health problem that would prevent you from having lung cancer treatment.  Fecal occult blood test (FOBT) of stool. / Every year beginning at age 57 and continuing until age 14. You may not have to do this test if you get a colonoscopy every 10 years.  Flexible sigmoidoscopy** or colonoscopy.** / Every 5 years for a flexible sigmoidoscopy or  every 10 years for a colonoscopy beginning at age 55 and continuing until age 43.  Hepatitis C blood test.** / For all people born from [birth year] through 1966 and any individual with known risks for hepatitis C.  Abdominal aortic aneurysm (AAA) screening.** / A one-time screening for ages 43 to 109 years who are current or former smokers.  Skin self-exam. / Monthly.  Influenza vaccine. / Every year.  Tetanus, diphtheria, and acellular pertussis (Tdap/Td) vaccine.** / 1 dose of Td every 10 years.  Varicella vaccine.** / Consult your health care provider.  Zoster vaccine.** / 1 dose for adults aged 53 years or older.  Pneumococcal 13-valent conjugate (PCV13) vaccine.** / Consult your health care provider.  Pneumococcal polysaccharide (PPSV23) vaccine.** / 1 dose for all adults aged 64 years and older.  Meningococcal vaccine.** / Consult your health care provider.  Hepatitis A vaccine.** / Consult your health care provider.  Hepatitis B vaccine.** / Consult your health care provider.  Haemophilus influenzae type b (Hib) vaccine.** / Consult your health care provider. **Family history and personal history of risk and conditions may change your health care provider's recommendations. Document Released: 01/17/2002 Document Revised: 11/26/2013 Document Reviewed: 04/18/2011 [facility] Patient Information 2015 [place], [place]. This information is not intended to replace advice given to you by your health care provider. Make sure you discuss any questions you have with your health care provider.

## 2015-02-24 NOTE — Progress Notes (Signed)
Pre visit review using our clinic review tool, if applicable. No additional management support is needed unless otherwise documented below in the visit note/SLS  

## 2015-02-24 NOTE — Telephone Encounter (Signed)
Urine Culture order placed, released and given to Sheena [lab] to be faxed over to [facility] lab [facility] & informed lab of order]/SLS

## 2015-02-24 NOTE — Addendum Note (Signed)
Addended by: Rockwell Germany on: 02/24/2015 06:44 PM   Modules accepted: Orders

## 2015-02-24 NOTE — Telephone Encounter (Signed)
Still waiting on most results.  Urine shows significant abnormalities to suspect UTI.  Rx cipro sent to pharmacy.  Please have lab add-on culture. Will call him once the rest of labs have resulted.

## 2015-02-24 NOTE — Progress Notes (Addendum)
Subjective:    Gregory Tate is a 64 y.o. male who presents for a welcome to Medicare exam.   Chronic Medical Problems: (1) Hypertension -- previously well controlled with Lisinopril-Hydrochlorothiazide. Patient denies chest pain, palpitations, lightheadedness, dizziness, vision changes or frequent headaches.    (2) Hyperlipidemia -- On Zocor 40 mg.  Denies myalgias with medication.  Is fasting for labs today.  Is on 81 mg ASA.  Cardiac risk factors: advanced age (older than 55 for men, 36 for women), dyslipidemia, hypertension and male gender.  Depression Screen (Note: if answer to either of the following is "Yes", a more complete depression screening is indicated)  Q1: Over the past two weeks, have you felt down, depressed or hopeless? no Q2: Over the past two weeks, have you felt little interest or pleasure in doing things? no  Activities of Daily Living In your present state of health, do you have any difficulty performing the following activities?:  Preparing food and eating?: No Bathing yourself: No Getting dressed: No Using the toilet:No Moving around from place to place: No In the past year have you fallen or had a near fall?:No  Current exercise habits: Home exercise routine includes walking 3 hrs per day.  Dietary issues discussed: Well-balanced diet.  Drinks plenty of fluids. Body mass index is 32.73 kg/(m^2).  Hearing difficulties: No Safe in current home environment: yes  The following portions of the patient's history were reviewed and updated as appropriate: allergies, current medications, past family history, past medical history, past social history, past surgical history and problem list.  Past Medical History  Diagnosis Date  . Hypertension   . Hyperlipidemia   . 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 Fatty liver     mild  . Dermatitis  01/28/2013  . Chicken pox   . Mumps    Current Outpatient Prescriptions on File Prior to Visit  Medication Sig Dispense Refill  . aspirin 81 MG tablet Take 81 mg by mouth daily.      . Cholecalciferol (VITAMIN D3) 1000 UNITS tablet Take 1,000 Units by mouth daily.      . Coenzyme Q10 (CO Q 10) 100 MG CAPS Take 100 mg by mouth daily.       No current facility-administered medications on file prior to visit.   No Known Allergies . Family History  Problem Relation Age of Onset  . Coronary artery disease Father 66    Deceased  . Hypertension Mother     Living  . Hypertension Father   . Diabetes Brother     borderline #1  . Alzheimer's disease Father   . Stroke Paternal Grandfather   . Heart attack Maternal Grandfather   . Arthritis Other   . Hypertension Brother     #2  . Heart disease Brother     #2  . Healthy Son     x1   History   Social History  . Marital Status: Married    Spouse Name: N/A  . Number of Children: N/A  . Years of Education: N/A   Social History Main Topics  . Smoking status: Never Smoker   . Smokeless tobacco: Never Used  . Alcohol Use: 4.2 oz/week    7 Cans of beer per week  . Drug Use: No  . Sexual Activity: Not on file   Other Topics Concern  .  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      [place]   Married for 77 year    one son 42    New grandchild   Alcohol use-yes (social)  < 14 drinks per week.     Review of Systems A comprehensive review of systems was negative.    Objective:   Vision by Snellen chart: right eye:20/15, left eye:20/50 Height 6\' 1"  (1.854 m). There is no weight on file to calculate BMI.   BP 130/82 mmHg  Pulse 64  Temp(Src) 98.5 F (36.9 C) (Oral)  Resp 16  Ht 6\' 1"  (1.854 m)  Wt 248 lb (112.492 kg)  BMI 32.73 kg/m2  SpO2 100%   General appearance: alert, cooperative, appears stated age and no distress Head:  Normocephalic, without obvious abnormality, atraumatic Eyes: conjunctivae/corneas clear. PERRL, EOM's intact. Fundi benign. Ears: normal TM's and external ear canals both ears Nose: Nares normal. Septum midline. Mucosa normal. No drainage or sinus tenderness. Throat: lips, mucosa, and tongue normal; teeth and gums normal Neck: no adenopathy, no carotid bruit, no JVD, supple, symmetrical, trachea midline and thyroid not enlarged, symmetric, no tenderness/mass/nodules Lungs: clear to auscultation bilaterally Heart: regular rate and rhythm, S1, S2 normal, no murmur, click, rub or gallop Abdomen: soft, non-tender; bowel sounds normal; no masses,  no organomegaly Male genitalia: normal Pulses: 2+ and symmetric Skin: Skin color, texture, turgor normal. No rashes or lesions Lymph nodes: Cervical, supraclavicular, and axillary nodes normal. Neurologic: Alert and oriented X 3, normal strength and tone. Normal symmetric reflexes. Normal coordination and gait    Assessment:     (1) Welcome to Medicare Exam   (2) Hypertension   (3) Hyperlipidemia   (4) Screening for Ischemic Heart Disease   (5) Prostate Cancer Screening     Plan:     During the course of the visit the patient was educated and counseled about appropriate screening and preventive services including:   Pneumococcal vaccine   Influenza vaccine  Screening electrocardiogram  Prostate cancer screening  Diabetes screening  Nutrition counseling   Advanced directives: has an advanced directive - a copy HAS NOT been provided.  Hypertension and hyperlipidemia well controlled previously.  Will continue current regimen and obtain fasting labs today. EKG with NSR. Continue 81 mg ASA daily.  Will obtain PSA level today after discussing risks/benefits of prostate cancer screening.  Patient Instructions (the written plan) was given to the patient.

## 2015-02-26 LAB — CULTURE, URINE COMPREHENSIVE

## 2015-04-08 DIAGNOSIS — R509 Fever, unspecified: Secondary | ICD-10-CM | POA: Diagnosis not present

## 2015-04-08 DIAGNOSIS — R1110 Vomiting, unspecified: Secondary | ICD-10-CM | POA: Diagnosis not present

## 2015-04-08 DIAGNOSIS — R197 Diarrhea, unspecified: Secondary | ICD-10-CM | POA: Diagnosis not present

## 2015-05-27 ENCOUNTER — Telehealth: Payer: Self-pay | Admitting: Physician Assistant

## 2015-05-27 DIAGNOSIS — E785 Hyperlipidemia, unspecified: Secondary | ICD-10-CM

## 2015-05-27 MED ORDER — SIMVASTATIN 40 MG PO TABS: ORAL_TABLET | ORAL | Status: DC

## 2015-05-27 NOTE — Telephone Encounter (Signed)
Notified pt that RX was sent to pharmacy

## 2015-05-27 NOTE — Telephone Encounter (Signed)
Caller name: Gregory Tate Relationship to patient: self Can be reached:  [phone] Pharmacy: [facility]  Reason for call: Pt called for refill on simvastatin (ZOCOR) 40 MG tablet. Pt has 1 week on hand. RX he has states no refills. Advised that 90 day supply with 3 refills was sent 02/24/15. Pt asked if we can resend, maybe the pharmacy made an error.

## 2015-05-27 NOTE — Telephone Encounter (Signed)
Medication sent again -- 90-day supply with refills.  Please make patient aware.

## 2015-08-29 ENCOUNTER — Other Ambulatory Visit: Payer: Self-pay | Admitting: Physician Assistant

## 2015-10-28 ENCOUNTER — Other Ambulatory Visit: Payer: Self-pay | Admitting: Physician Assistant

## 2015-11-30 ENCOUNTER — Other Ambulatory Visit: Payer: Self-pay | Admitting: Physician Assistant

## 2015-12-17 ENCOUNTER — Telehealth: Payer: Self-pay | Admitting: Physician Assistant

## 2015-12-17 NOTE — Telephone Encounter (Signed)
I would not have the results at time of visit. Also I would not know what all to draw for (other than basics) in case there are other concerns the patient has at time of visit that would warrant other tests. I would recommend since I wouldn't have the results the same day anyway if he would just have them done right after his visit.

## 2015-12-17 NOTE — Telephone Encounter (Signed)
Please advise.//AB/CMA 

## 2015-12-17 NOTE — Telephone Encounter (Signed)
Pt scheduled [facility] Wellness for 01/13/16 2:30pm. He wants to have the labs the same day just before appt with you (2:15pm). Please enter lab orders as needed.

## 2015-12-17 NOTE — Telephone Encounter (Signed)
Accidentally closed - see below

## 2015-12-18 NOTE — Telephone Encounter (Signed)
Called and spoke with the pt and informed him of the note below.  Pt verbalized understanding and agreed.//AB/CMA

## 2016-01-13 ENCOUNTER — Ambulatory Visit: Payer: Self-pay | Admitting: Physician Assistant

## 2016-01-14 ENCOUNTER — Encounter: Payer: Self-pay | Admitting: Behavioral Health

## 2016-01-14 ENCOUNTER — Telehealth: Payer: Self-pay | Admitting: Behavioral Health

## 2016-01-14 NOTE — Telephone Encounter (Signed)
Pre-Visit Call completed with patient and chart updated.   Pre-Visit Info documented in Specialty Comments under [employer].    

## 2016-01-15 ENCOUNTER — Encounter: Payer: Self-pay | Admitting: Physician Assistant

## 2016-01-15 ENCOUNTER — Ambulatory Visit (INDEPENDENT_AMBULATORY_CARE_PROVIDER_SITE_OTHER): Payer: Medicare Other | Admitting: Physician Assistant

## 2016-01-15 VITALS — BP 129/72 | HR 70 | Temp 98.1°F | Ht 73.0 in | Wt 251.2 lb

## 2016-01-15 DIAGNOSIS — I10 Essential (primary) hypertension: Secondary | ICD-10-CM

## 2016-01-15 DIAGNOSIS — Z23 Encounter for immunization: Secondary | ICD-10-CM | POA: Insufficient documentation

## 2016-01-15 MED ORDER — ZOSTER VACCINE LIVE 19400 UNT/0.65ML ~~LOC~~ SOLR: 0.6500 mL | Freq: Once | SUBCUTANEOUS | Status: DC

## 2016-01-15 NOTE — Assessment & Plan Note (Signed)
Controlled. Asymptomatic. Continue current regimen. Will repeat labs at wellness next month.

## 2016-01-15 NOTE — Progress Notes (Signed)
Patient presents to clinic today for Medicare Wellness. Unfortunately it has not been a year since last Wellness so would not be covered. After discussion patient decides to treat today as a follow-up of hypertension.  Endorses taking medications as directed. Patient denies chest pain, palpitations, lightheadedness, dizziness, vision changes or frequent headaches.   BP Readings from Last 3 Encounters:  01/15/16 129/72  02/24/15 130/82  02/20/14 124/80   Patient would like to discuss shingles vaccination.  Past Medical History  Diagnosis Date  . Hypertension   . Hyperlipidemia   . 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 Fatty liver     mild  . Dermatitis 01/28/2013  . Chicken pox   . Mumps     Current Outpatient Prescriptions on File Prior to Visit  Medication Sig Dispense Refill  . aspirin 81 MG tablet Take 81 mg by mouth daily.      . Cholecalciferol (VITAMIN D3) 1000 UNITS tablet Take 1,000 Units by mouth daily.      . clobetasol cream (TEMOVATE) 0.05 % Apply topically daily as needed. To rash 60 g 2  . Coenzyme Q10 (CO Q 10) 100 MG CAPS Take 100 mg by mouth daily.      Marland Kitchen lisinopril-hydrochlorothiazide (PRINZIDE,ZESTORETIC) 20-25 MG tablet TAKE 1 TABLET EVERY DAY 90 tablet 0  . NON FORMULARY Cherry Extract as needed for Gout    . simvastatin (ZOCOR) 40 MG tablet TAKE 1 TABLET EVERY NIGHT AT BEDTIME 90 tablet 0   No current facility-administered medications on file prior to visit.    No Known Allergies  Family History  Problem Relation Age of Onset  . Coronary artery disease Father 75    Deceased  . Hypertension Mother     Living  . Hypertension Father   . Diabetes Brother     borderline #1  . Alzheimer's disease Father   . Stroke Paternal Grandfather   . Heart attack Maternal Grandfather   . Arthritis Other   . Hypertension Brother     #2  .  Heart disease Brother     #2  . Healthy Son     x1    Social History   Social History  . Marital Status: Married    Spouse Name: N/A  . Number of Children: N/A  . Years of Education: N/A   Social History Main Topics  . Smoking status: Never Smoker   . Smokeless tobacco: Never Used  . Alcohol Use: 4.2 oz/week    7 Cans of beer per week  . Drug Use: No  . Sexual Activity: Not Asked   Other Topics Concern  . 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      [place]   Married for 52 year    one son 46    New grandchild   Alcohol use-yes (social)  < 14 drinks per week.   Review of Systems - See HPI.  All other ROS are negative.  BP 129/72 mmHg  Pulse 70  Temp(Src) 98.1 F (36.7 C) (Oral)  Ht 6\' 1"  (1.854 m)  Wt 251 lb 3.2 oz (113.944 kg)  BMI 33.15 kg/m2  SpO2 100%  Physical Exam  Constitutional: He is well-developed, well-nourished, and in no distress.  HENT:  Head:  Normocephalic and atraumatic.  Eyes: Conjunctivae are normal.  Neck: Neck supple.  Cardiovascular: Normal rate, regular rhythm, normal heart sounds and intact distal pulses.   Pulmonary/Chest: Effort normal and breath sounds normal. No respiratory distress. He has no wheezes. He has no rales. He exhibits no tenderness.  Skin: Skin is warm and dry. No rash noted.  Psychiatric: Affect normal.  Vitals reviewed.  No results found for this or any previous visit (from the past 2160 hour(s)).  Assessment/Plan: Essential hypertension Controlled. Asymptomatic. Continue current regimen. Will repeat labs at wellness next month.  Need for shingles vaccine Has had chicken pox. He is a candidate for vaccine. Rx printed. Patient is to call insurance to discuss where vaccine will be the most covered.

## 2016-01-15 NOTE — Progress Notes (Signed)
Pre visit review using our clinic review tool, if applicable. No additional management support is needed unless otherwise documented below in the visit note. 

## 2016-01-15 NOTE — Patient Instructions (Signed)
Please continue medications as directed. Stay active and watch your diet.  Follow-up with me in April for your wellness.

## 2016-01-15 NOTE — Assessment & Plan Note (Signed)
Has had chicken pox. He is a candidate for vaccine. Rx printed. Patient is to call insurance to discuss where vaccine will be the most covered.

## 2016-03-02 ENCOUNTER — Ambulatory Visit (INDEPENDENT_AMBULATORY_CARE_PROVIDER_SITE_OTHER): Payer: Medicare Other | Admitting: Medical

## 2016-03-02 ENCOUNTER — Telehealth: Payer: Self-pay | Admitting: Medical

## 2016-03-02 ENCOUNTER — Ambulatory Visit (HOSPITAL_BASED_OUTPATIENT_CLINIC_OR_DEPARTMENT_OTHER)
Admission: RE | Admit: 2016-03-02 | Discharge: 2016-03-02 | Disposition: A | Discharge status: Home / Self Care | Payer: Medicare Other | Source: Ambulatory Visit | Attending: Medical | Admitting: Medical

## 2016-03-02 ENCOUNTER — Encounter: Payer: Self-pay | Admitting: Medical

## 2016-03-02 VITALS — BP 138/80 | HR 89 | Temp 98.1°F | Ht 73.0 in | Wt 255.0 lb

## 2016-03-02 DIAGNOSIS — M7989 Other specified soft tissue disorders: Secondary | ICD-10-CM | POA: Diagnosis not present

## 2016-03-02 DIAGNOSIS — M25522 Pain in left elbow: Secondary | ICD-10-CM | POA: Diagnosis not present

## 2016-03-02 NOTE — Telephone Encounter (Signed)
I advised pt to get xray today. But not sure I gave him direction where to go. Will you see if he got xray. Explain where dept is.

## 2016-03-02 NOTE — Progress Notes (Signed)
Subjective:    Patient ID: Gregory Tate, male    DOB: 01-Feb-1951, 65 y.o.   MRN: [id number]  HPI Pt in for 10 days of left elbow swelling and tednerness.Described moderate to severe swelling. Has gradually improved. Pt has used biofreeze and alleve. Pt has had this before in the past about once each spring but described more mild tennnis elbow. Pt is left handed. He does report occasional lateral elbow pain. But he doe not report swelling like bursitis.   Pt is active doing push ups and riding his motorcycle. He states clutch is tight and take forearm strength to pull in.  Pt had pain in lateral epicondyle region every year in the spring.   Review of Systems  Constitutional: Negative for fever, chills and fatigue.  Cardiovascular: Negative for chest pain.  Gastrointestinal: Negative for abdominal distention.  Musculoskeletal:       Lt elbow pain.   Hematological: Negative for adenopathy. Does not bruise/bleed easily.    Past Medical History  Diagnosis Date  . Hypertension   . Hyperlipidemia   . 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 Fatty liver     mild  . Dermatitis 01/28/2013  . Chicken pox   . Mumps     Social History   Social History  . Marital Status: Married    Spouse Name: N/A  . Number of Children: N/A  . Years of Education: N/A   Occupational History  . Not on file.   Social History Main Topics  . Smoking status: Never Smoker   . Smokeless tobacco: Never Used  . Alcohol Use: 4.2 oz/week    7 Cans of beer per week  . Drug Use: No  . Sexual Activity: Not on file   Other Topics Concern  . 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      [place]   Married for 37 year    one son 41    New grandchild   Alcohol use-yes (social)  < 14 drinks per  week.    Past Surgical History  Procedure Laterality Date  . Colonoscopy  2007  . Urinary diversion  2002    radical cystoprostatecomy and urinary diversion   . Tonsillectomy      Family History  Problem Relation Age of Onset  . Coronary artery disease Father 76    Deceased  . Hypertension Mother     Living  . Hypertension Father   . Diabetes Brother     borderline #1  . Alzheimer's disease Father   . Stroke Paternal Grandfather   . Heart attack Maternal Grandfather   . Arthritis Other   . Hypertension Brother     #2  . Heart disease Brother     #2  . Healthy Son     x1    No Known Allergies  Current Outpatient Prescriptions on File Prior to Visit  Medication Sig Dispense Refill  . aspirin 81 MG tablet Take 81 mg by mouth daily.      . Cholecalciferol (VITAMIN D3) 1000 UNITS tablet Take 1,000 Units by mouth daily.      . clobetasol cream (TEMOVATE) 0.05 % Apply topically daily as needed. To rash 60 g 2  . Coenzyme Q10 (  CO Q 10) 100 MG CAPS Take 100 mg by mouth daily.      Marland Kitchen lisinopril-hydrochlorothiazide (PRINZIDE,ZESTORETIC) 20-25 MG tablet TAKE 1 TABLET EVERY DAY 90 tablet 0  . NON FORMULARY Cherry Extract as needed for Gout    . simvastatin (ZOCOR) 40 MG tablet TAKE 1 TABLET EVERY NIGHT AT BEDTIME 90 tablet 0  . zoster vaccine live, PF, (ZOSTAVAX) 24401 UNT/0.65ML injection Inject 19,400 Units into the skin once. 1 each 0   No current facility-administered medications on file prior to visit.    BP 138/80 mmHg  Pulse 89  Temp(Src) 98.1 F (36.7 C) (Oral)  Ht 6\' 1"  (1.854 m)  Wt 255 lb (115.667 kg)  BMI 33.65 kg/m2  SpO2 98%       Objective:   Physical Exam  General- No acute distress. Pleasant patient. Neck- Full range of motion, no jvd Lungs- Clear, even and unlabored. Heart- regular rate and rhythm. Neurologic- CNII- XII grossly intact. Lt elbow- lateral epicondyle pain on palpation. Moderate swelling in that area and bursae moderate to severe  inflammed.(But he states is much improved compared to 10 days ago).  No redness to skin. Lt elbow good rom. But moves little stiffly.       Assessment & Plan:  For elbow pain use alleve otc/can continue. For future when bursitis not present use tennis elbow brace.  Ice elbow twice daily.  After rest, nsaid, and ice will see how you do. If not back to baseline by next wed then offer sports medicine referral.(pt declined referral today)  Get xray today of elbow. Pt advised.  Follow up in 7 days or as needed.

## 2016-03-02 NOTE — Progress Notes (Signed)
Pre visit review using our clinic review tool, if applicable. No additional management support is needed unless otherwise documented below in the visit note. 

## 2016-03-02 NOTE — Patient Instructions (Addendum)
For elbow pain use alleve otc/ can continue. For future when bursitis not present use tennis elbow brace.  Ice elbow twice daily.  After rest, nsaid, and ice will see how you do. If not back to baseline by next wed then offer sports medicine referral.  Get xray today. Pt advised.  Follow up in 7 days or as needed.

## 2016-03-02 NOTE — Telephone Encounter (Signed)
Patient states he went and had XR done today. Advised we will call with results.

## 2016-03-14 ENCOUNTER — Other Ambulatory Visit: Payer: Self-pay | Admitting: Physician Assistant

## 2016-05-16 ENCOUNTER — Other Ambulatory Visit: Payer: Self-pay | Admitting: Physician Assistant

## 2016-05-17 NOTE — Telephone Encounter (Signed)
Rx request Denied d/t medication refill not due until 06/12/16 and pt needs physical with fasting labs [due in April]/SLS 06/13  Please call patient and have him schedule Medicare wellness with [facility] & Physical with fasting labs with [place]/SLS 06/13 Thanks.

## 2016-05-17 NOTE — Telephone Encounter (Signed)
Scheduled pt for follow up.

## 2016-05-27 ENCOUNTER — Encounter: Payer: Self-pay | Admitting: Physician Assistant

## 2016-05-27 ENCOUNTER — Ambulatory Visit (INDEPENDENT_AMBULATORY_CARE_PROVIDER_SITE_OTHER): Payer: Medicare Other | Admitting: Physician Assistant

## 2016-05-27 VITALS — BP 120/72 | HR 64 | Temp 98.0°F | Resp 16 | Ht 73.0 in | Wt 247.4 lb

## 2016-05-27 DIAGNOSIS — I10 Essential (primary) hypertension: Secondary | ICD-10-CM | POA: Diagnosis not present

## 2016-05-27 DIAGNOSIS — Z1211 Encounter for screening for malignant neoplasm of colon: Secondary | ICD-10-CM | POA: Diagnosis not present

## 2016-05-27 DIAGNOSIS — Z0000 Encounter for general adult medical examination without abnormal findings: Secondary | ICD-10-CM | POA: Diagnosis not present

## 2016-05-27 DIAGNOSIS — E785 Hyperlipidemia, unspecified: Secondary | ICD-10-CM

## 2016-05-27 LAB — CBC
HEMATOCRIT: 46.4 % (ref 39.0–52.0)
HEMOGLOBIN: 15.8 g/dL (ref 13.0–17.0)
MCHC: 34.1 g/dL (ref 30.0–36.0)
MCV: 88.8 fl (ref 78.0–100.0)
Platelets: 192 10*3/uL (ref 150.0–400.0)
RBC: 5.23 Mil/uL (ref 4.22–5.81)
RDW: 12.8 % (ref 11.5–15.5)
WBC: 6.1 10*3/uL (ref 4.0–10.5)

## 2016-05-27 LAB — COMPREHENSIVE METABOLIC PANEL
ALK PHOS: 36 U/L — AB (ref 39–117)
ALT: 25 U/L (ref 0–53)
AST: 19 U/L (ref 0–37)
Albumin: 4.3 g/dL (ref 3.5–5.2)
BUN: 29 mg/dL — AB (ref 6–23)
CHLORIDE: 102 meq/L (ref 96–112)
CO2: 31 meq/L (ref 19–32)
Calcium: 9.6 mg/dL (ref 8.4–10.5)
Creatinine, Ser: 0.92 mg/dL (ref 0.40–1.50)
GFR: 87.39 mL/min (ref >60.00)
GLUCOSE: 132 mg/dL — AB (ref 70–99)
POTASSIUM: 4.3 meq/L (ref 3.5–5.1)
Sodium: 138 mEq/L (ref 135–145)
TOTAL PROTEIN: 6.8 g/dL (ref 6.0–8.3)
Total Bilirubin: 0.7 mg/dL (ref 0.2–1.2)

## 2016-05-27 LAB — URINALYSIS, ROUTINE W REFLEX MICROSCOPIC
Bilirubin Urine: NEGATIVE
Ketones, ur: NEGATIVE
Leukocytes, UA: NEGATIVE
Nitrite: POSITIVE — AB
PH: 6.5 (ref 5.0–8.0)
SPECIFIC GRAVITY, URINE: 1.015 (ref 1.000–1.030)
Urine Glucose: NEGATIVE
Urobilinogen, UA: 0.2 (ref 0.0–1.0)

## 2016-05-27 LAB — LIPID PANEL
CHOL/HDL RATIO: 3
Cholesterol: 158 mg/dL (ref 0–200)
HDL: 48.8 mg/dL (ref >39.00)
LDL CALC: 81 mg/dL (ref 0–99)
NonHDL: 109.05
Triglycerides: 142 mg/dL (ref 0.0–149.0)
VLDL: 28.4 mg/dL (ref 0.0–40.0)

## 2016-05-27 MED ORDER — SIMVASTATIN 40 MG PO TABS: 40.0000 mg | ORAL_TABLET | Freq: Every day | ORAL | Status: DC

## 2016-05-27 MED ORDER — LISINOPRIL-HYDROCHLOROTHIAZIDE 20-25 MG PO TABS: 1.0000 | ORAL_TABLET | Freq: Every day | ORAL | Status: DC

## 2016-05-27 NOTE — Patient Instructions (Signed)
Please go to the lab for blood work.  I will call you with your results.  Please continue medications as directed. You will be contacted to schedule a colonoscopy.  I encourage you to increase hydration and the amount of fiber in your diet.  Start a daily probiotic (Align, Culturelle, Digestive Advantage, etc.). If no bowel movement within 24 hours, take 2 Tbs of Milk of Magnesia in a 4 oz glass of warmed prune juice every 2-3 days to help promote bowel movement. If no results within 24 hours, then repeat above regimen, adding a Dulcolax stool softener to regimen. If this does not promote a bowel movement, please call the office.  For the foot -- wear arch supports in the shoes. Do the cold can exercises we discussed.  Apply topical Icy Hot or Aspercreme to the area. If not resolving, we may need imaging or Sports Medicine/Podiatry assessment.  Preventive Care for Adults, Male A healthy lifestyle and preventive care can promote health and wellness. Preventive health guidelines for men include the following key practices:  A routine yearly physical is a good way to check with your health care provider about your health and preventative screening. It is a chance to share any concerns and updates on your health and to receive a thorough exam.  Visit your dentist for a routine exam and preventative care every 6 months. Brush your teeth twice a day and floss once a day. Good oral hygiene prevents tooth decay and gum disease.  The frequency of eye exams is based on your age, health, family medical history, use of contact lenses, and other factors. Follow your health care provider's recommendations for frequency of eye exams.  Eat a healthy diet. Foods such as vegetables, fruits, whole grains, low-fat dairy products, and lean protein foods contain the nutrients you need without too many calories. Decrease your intake of foods high in solid fats, added sugars, and salt. Eat the right amount of calories  for you.Get information about a proper diet from your health care provider, if necessary.  Regular physical exercise is one of the most important things you can do for your health. Most adults should get at least 150 minutes of moderate-intensity exercise (any activity that increases your heart rate and causes you to sweat) each week. In addition, most adults need muscle-strengthening exercises on 2 or more days a week.  Maintain a healthy weight. The body mass index (BMI) is a screening tool to identify possible weight problems. It provides an estimate of body fat based on height and weight. Your health care provider can find your BMI and can help you achieve or maintain a healthy weight.For adults 20 years and older:  A BMI below 18.5 is considered underweight.  A BMI of 18.5 to 24.9 is normal.  A BMI of 25 to 29.9 is considered overweight.  A BMI of 30 and above is considered obese.  Maintain normal blood lipids and cholesterol levels by exercising and minimizing your intake of saturated fat. Eat a balanced diet with plenty of fruit and vegetables. Blood tests for lipids and cholesterol should begin at age 83 and be repeated every 5 years. If your lipid or cholesterol levels are high, you are over 50, or you are at high risk for heart disease, you may need your cholesterol levels checked more frequently.Ongoing high lipid and cholesterol levels should be treated with medicines if diet and exercise are not working.  If you smoke, find out from your health care provider  how to quit. If you do not use tobacco, do not start.  Lung cancer screening is recommended for adults aged 71-80 years who are at high risk for developing lung cancer because of a history of smoking. A yearly low-dose CT scan of the lungs is recommended for people who have at least a 30-pack-year history of smoking and are a current smoker or have quit within the past 15 years. A pack year of smoking is smoking an average of 1  pack of cigarettes a day for 1 year (for example: 1 pack a day for 30 years or 2 packs a day for 15 years). Yearly screening should continue until the smoker has stopped smoking for at least 15 years. Yearly screening should be stopped for people who develop a health problem that would prevent them from having lung cancer treatment.  If you choose to drink alcohol, do not have more than 2 drinks per day. One drink is considered to be 12 ounces (355 mL) of beer, 5 ounces (148 mL) of wine, or 1.5 ounces (44 mL) of liquor.  Avoid use of street drugs. Do not share needles with anyone. Ask for help if you need support or instructions about stopping the use of drugs.  High blood pressure causes heart disease and increases the risk of stroke. Your blood pressure should be checked at least every 1-2 years. Ongoing high blood pressure should be treated with medicines, if weight loss and exercise are not effective.  If you are 48-68 years old, ask your health care provider if you should take aspirin to prevent heart disease.  Diabetes screening is done by taking a blood sample to check your blood glucose level after you have not eaten for a certain period of time (fasting). If you are not overweight and you do not have risk factors for diabetes, you should be screened once every 3 years starting at age 61. If you are overweight or obese and you are 63-81 years of age, you should be screened for diabetes every year as part of your cardiovascular risk assessment.  Colorectal cancer can be detected and often prevented. Most routine colorectal cancer screening begins at the age of 71 and continues through age 52. However, your health care provider may recommend screening at an earlier age if you have risk factors for colon cancer. On a yearly basis, your health care provider may provide home test kits to check for hidden blood in the stool. Use of a small camera at the end of a tube to directly examine the colon  (sigmoidoscopy or colonoscopy) can detect the earliest forms of colorectal cancer. Talk to your health care provider about this at age 7, when routine screening begins. Direct exam of the colon should be repeated every 5-10 years through age 64, unless early forms of precancerous polyps or small growths are found.  People who are at an increased risk for hepatitis B should be screened for this virus. You are considered at high risk for hepatitis B if:  You were born in a country where hepatitis B occurs often. Talk with your health care provider about which countries are considered high risk.  Your parents were born in a high-risk country and you have not received a shot to protect against hepatitis B (hepatitis B vaccine).  You have HIV or AIDS.  You use needles to inject street drugs.  You live with, or have sex with, someone who has hepatitis B.  You are a man who  has sex with other men (MSM).  You get hemodialysis treatment.  You take certain medicines for conditions such as cancer, organ transplantation, and autoimmune conditions.  Hepatitis C blood testing is recommended for all people born from [birth year] through 1965 and any individual with known risks for hepatitis C.  Practice safe sex. Use condoms and avoid high-risk sexual practices to reduce the spread of sexually transmitted infections (STIs). STIs include gonorrhea, chlamydia, syphilis, trichomonas, herpes, HPV, and human immunodeficiency virus (HIV). Herpes, HIV, and HPV are viral illnesses that have no cure. They can result in disability, cancer, and death.  If you are a man who has sex with other men, you should be screened at least once per year for:  HIV.  Urethral, rectal, and pharyngeal infection of gonorrhea, chlamydia, or both.  If you are at risk of being infected with HIV, it is recommended that you take a prescription medicine daily to prevent HIV infection. This is called preexposure prophylaxis (PrEP). You are  considered at risk if:  You are a man who has sex with other men (MSM) and have other risk factors.  You are a heterosexual man, are sexually active, and are at increased risk for HIV infection.  You take drugs by injection.  You are sexually active with a partner who has HIV.  Talk with your health care provider about whether you are at high risk of being infected with HIV. If you choose to begin PrEP, you should first be tested for HIV. You should then be tested every 3 months for as long as you are taking PrEP.  A one-time screening for abdominal aortic aneurysm (AAA) and surgical repair of large AAAs by ultrasound are recommended for men ages 78 to 2 years who are current or former smokers.  Healthy men should no longer receive prostate-specific antigen (PSA) blood tests as part of routine cancer screening. Talk with your health care provider about prostate cancer screening.  Testicular cancer screening is not recommended for adult males who have no symptoms. Screening includes self-exam, a health care provider exam, and other screening tests. Consult with your health care provider about any symptoms you have or any concerns you have about testicular cancer.  Use sunscreen. Apply sunscreen liberally and repeatedly throughout the day. You should seek shade when your shadow is shorter than you. Protect yourself by wearing long sleeves, pants, a wide-brimmed hat, and sunglasses year round, whenever you are outdoors.  Once a month, do a whole-body skin exam, using a mirror to look at the skin on your back. Tell your health care provider about new moles, moles that have irregular borders, moles that are larger than a pencil eraser, or moles that have changed in shape or color.  Stay current with required vaccines (immunizations).  Influenza vaccine. All adults should be immunized every year.  Tetanus, diphtheria, and acellular pertussis (Td, Tdap) vaccine. An adult who has not previously  received Tdap or who does not know his vaccine status should receive 1 dose of Tdap. This initial dose should be followed by tetanus and diphtheria toxoids (Td) booster doses every 10 years. Adults with an unknown or incomplete history of completing a 3-dose immunization series with Td-containing vaccines should begin or complete a primary immunization series including a Tdap dose. Adults should receive a Td booster every 10 years.  Varicella vaccine. An adult without evidence of immunity to varicella should receive 2 doses or a second dose if he has previously received 1 dose.  Human  papillomavirus (HPV) vaccine. Males aged 11-21 years who have not received the vaccine previously should receive the 3-dose series. Males aged 22-26 years may be immunized. Immunization is recommended through the age of 32 years for any male who has sex with males and did not get any or all doses earlier. Immunization is recommended for any person with an immunocompromised condition through the age of 1 years if he did not get any or all doses earlier. During the 3-dose series, the second dose should be obtained 4-8 weeks after the first dose. The third dose should be obtained 24 weeks after the first dose and 16 weeks after the second dose.  Zoster vaccine. One dose is recommended for adults aged 22 years or older unless certain conditions are present.  Measles, mumps, and rubella (MMR) vaccine. Adults born before [birth year] generally are considered immune to measles and mumps. Adults born in [birth year] or later should have 1 or more doses of MMR vaccine unless there is a contraindication to the vaccine or there is laboratory evidence of immunity to each of the three diseases. A routine second dose of MMR vaccine should be obtained at least 28 days after the first dose for students attending postsecondary schools, health care workers, or international travelers. People who received inactivated measles vaccine or an unknown type of  measles vaccine during 1963-1967 should receive 2 doses of MMR vaccine. People who received inactivated mumps vaccine or an unknown type of mumps vaccine before 1979 and are at high risk for mumps infection should consider immunization with 2 doses of MMR vaccine. Unvaccinated health care workers born before [birth year] who lack laboratory evidence of measles, mumps, or rubella immunity or laboratory confirmation of disease should consider measles and mumps immunization with 2 doses of MMR vaccine or rubella immunization with 1 dose of MMR vaccine.  Pneumococcal 13-valent conjugate (PCV13) vaccine. When indicated, a person who is uncertain of his immunization history and has no record of immunization should receive the PCV13 vaccine. All adults 52 years of age and older should receive this vaccine. An adult aged 15 years or older who has certain medical conditions and has not been previously immunized should receive 1 dose of PCV13 vaccine. This PCV13 should be followed with a dose of pneumococcal polysaccharide (PPSV23) vaccine. Adults who are at high risk for pneumococcal disease should obtain the PPSV23 vaccine at least 8 weeks after the dose of PCV13 vaccine. Adults older than 66 years of age who have normal immune system function should obtain the PPSV23 vaccine dose at least 1 year after the dose of PCV13 vaccine.  Pneumococcal polysaccharide (PPSV23) vaccine. When PCV13 is also indicated, PCV13 should be obtained first. All adults aged 64 years and older should be immunized. An adult younger than age 77 years who has certain medical conditions should be immunized. Any person who resides in a nursing home or long-term care facility should be immunized. An adult smoker should be immunized. People with an immunocompromised condition and certain other conditions should receive both PCV13 and PPSV23 vaccines. People with human immunodeficiency virus (HIV) infection should be immunized as soon as possible after  diagnosis. Immunization during chemotherapy or radiation therapy should be avoided. Routine use of PPSV23 vaccine is not recommended for American Indians, [place]atives, or people younger than 65 years unless there are medical conditions that require PPSV23 vaccine. When indicated, people who have unknown immunization and have no record of immunization should receive PPSV23 vaccine. One-time revaccination 5 years after the first  dose of PPSV23 is recommended for people aged 19-64 years who have chronic kidney failure, nephrotic syndrome, asplenia, or immunocompromised conditions. People who received 1-2 doses of PPSV23 before age 59 years should receive another dose of PPSV23 vaccine at age 23 years or later if at least 5 years have passed since the previous dose. Doses of PPSV23 are not needed for people immunized with PPSV23 at or after age 58 years.  Meningococcal vaccine. Adults with asplenia or persistent complement component deficiencies should receive 2 doses of quadrivalent meningococcal conjugate (MenACWY-D) vaccine. The doses should be obtained at least 2 months apart. Microbiologists working with certain meningococcal bacteria, [place] recruits, people at risk during an outbreak, and people who travel to or live in countries with a high rate of meningitis should be immunized. A first-year college student up through age 41 years who is living in a residence hall should receive a dose if he did not receive a dose on or after his 16th birthday. Adults who have certain high-risk conditions should receive one or more doses of vaccine.  Hepatitis A vaccine. Adults who wish to be protected from this disease, have chronic liver disease, work with hepatitis A-infected animals, work in hepatitis A research labs, or travel to or work in countries with a high rate of hepatitis A should be immunized. Adults who were previously unvaccinated and who anticipate close contact with an international adoptee during the  first 60 days after arrival in the [place] from a country with a high rate of hepatitis A should be immunized.  Hepatitis B vaccine. Adults should be immunized if they wish to be protected from this disease, are under age 49 years and have diabetes, have chronic liver disease, have had more than one sex partner in the past 6 months, may be exposed to blood or other infectious body fluids, are household contacts or sex partners of hepatitis B positive people, are clients or workers in certain care facilities, or travel to or work in countries with a high rate of hepatitis B.  Haemophilus influenzae type b (Hib) vaccine. A previously unvaccinated person with asplenia or sickle cell disease or having a scheduled splenectomy should receive 1 dose of Hib vaccine. Regardless of previous immunization, a recipient of a hematopoietic stem cell transplant should receive a 3-dose series 6-12 months after his successful transplant. Hib vaccine is not recommended for adults with HIV infection. Preventive Service / Frequency Ages 63 to 39  Blood pressure check.** / Every 3-5 years.  Lipid and cholesterol check.** / Every 5 years beginning at age 81.  Hepatitis C blood test.** / For any individual with known risks for hepatitis C.  Skin self-exam. / Monthly.  Influenza vaccine. / Every year.  Tetanus, diphtheria, and acellular pertussis (Tdap, Td) vaccine.** / Consult your health care provider. 1 dose of Td every 10 years.  Varicella vaccine.** / Consult your health care provider.  HPV vaccine. / 3 doses over 6 months, if 64 or younger.  Measles, mumps, rubella (MMR) vaccine.** / You need at least 1 dose of MMR if you were born in 1957 or later. You may also need a second dose.  Pneumococcal 13-valent conjugate (PCV13) vaccine.** / Consult your health care provider.  Pneumococcal polysaccharide (PPSV23) vaccine.** / 1 to 2 doses if you smoke cigarettes or if you have certain  conditions.  Meningococcal vaccine.** / 1 dose if you are age 26 to 96 years and a Market researcher living in a residence hall, or  have one of several medical conditions. You may also need additional booster doses.  Hepatitis A vaccine.** / Consult your health care provider.  Hepatitis B vaccine.** / Consult your health care provider.  Haemophilus influenzae type b (Hib) vaccine.** / Consult your health care provider. Ages 6 to 54  Blood pressure check.** / Every year.  Lipid and cholesterol check.** / Every 5 years beginning at age 55.  Lung cancer screening. / Every year if you are aged 16-80 years and have a 30-pack-year history of smoking and currently smoke or have quit within the past 15 years. Yearly screening is stopped once you have quit smoking for at least 15 years or develop a health problem that would prevent you from having lung cancer treatment.  Fecal occult blood test (FOBT) of stool. / Every year beginning at age 67 and continuing until age 85. You may not have to do this test if you get a colonoscopy every 10 years.  Flexible sigmoidoscopy** or colonoscopy.** / Every 5 years for a flexible sigmoidoscopy or every 10 years for a colonoscopy beginning at age 74 and continuing until age 37.  Hepatitis C blood test.** / For all people born from [birth year] through 1965 and any individual with known risks for hepatitis C.  Skin self-exam. / Monthly.  Influenza vaccine. / Every year.  Tetanus, diphtheria, and acellular pertussis (Tdap/Td) vaccine.** / Consult your health care provider. 1 dose of Td every 10 years.  Varicella vaccine.** / Consult your health care provider.  Zoster vaccine.** / 1 dose for adults aged 50 years or older.  Measles, mumps, rubella (MMR) vaccine.** / You need at least 1 dose of MMR if you were born in 1957 or later. You may also need a second dose.  Pneumococcal 13-valent conjugate (PCV13) vaccine.** / Consult your health care  provider.  Pneumococcal polysaccharide (PPSV23) vaccine.** / 1 to 2 doses if you smoke cigarettes or if you have certain conditions.  Meningococcal vaccine.** / Consult your health care provider.  Hepatitis A vaccine.** / Consult your health care provider.  Hepatitis B vaccine.** / Consult your health care provider.  Haemophilus influenzae type b (Hib) vaccine.** / Consult your health care provider. Ages 29 and over  Blood pressure check.** / Every year.  Lipid and cholesterol check.**/ Every 5 years beginning at age 28.  Lung cancer screening. / Every year if you are aged 13-80 years and have a 30-pack-year history of smoking and currently smoke or have quit within the past 15 years. Yearly screening is stopped once you have quit smoking for at least 15 years or develop a health problem that would prevent you from having lung cancer treatment.  Fecal occult blood test (FOBT) of stool. / Every year beginning at age 85 and continuing until age 43. You may not have to do this test if you get a colonoscopy every 10 years.  Flexible sigmoidoscopy** or colonoscopy.** / Every 5 years for a flexible sigmoidoscopy or every 10 years for a colonoscopy beginning at age 19 and continuing until age 24.  Hepatitis C blood test.** / For all people born from [birth year] through 1966 and any individual with known risks for hepatitis C.  Abdominal aortic aneurysm (AAA) screening.** / A one-time screening for ages 65 to 81 years who are current or former smokers.  Skin self-exam. / Monthly.  Influenza vaccine. / Every year.  Tetanus, diphtheria, and acellular pertussis (Tdap/Td) vaccine.** / 1 dose of Td every 10 years.  Varicella vaccine.** / Consult  your health care provider.  Zoster vaccine.** / 1 dose for adults aged 66 years or older.  Pneumococcal 13-valent conjugate (PCV13) vaccine.** / 1 dose for all adults aged 19 years and older.  Pneumococcal polysaccharide (PPSV23) vaccine.** / 1 dose for  all adults aged 48 years and older.  Meningococcal vaccine.** / Consult your health care provider.  Hepatitis A vaccine.** / Consult your health care provider.  Hepatitis B vaccine.** / Consult your health care provider.  Haemophilus influenzae type b (Hib) vaccine.** / Consult your health care provider. **Family history and personal history of risk and conditions may change your health care provider's recommendations.   This information is not intended to replace advice given to you by your health care provider. Make sure you discuss any questions you have with your health care provider.   Document Released: 01/17/2002 Document Revised: 12/12/2014 Document Reviewed: 04/18/2011 Elsevier Interactive Patient Education [employer].

## 2016-05-27 NOTE — Progress Notes (Signed)
Subjective:    Gregory Tate is a 65 y.o. male who presents for Medicare Annual/Subsequent preventive examination.   Preventive Screening-Counseling & Management  Tobacco History  Smoking status  . Never Smoker   Smokeless tobacco  . Never Used    Problems Prior to Visit 1. Patient c/o right heel pain, sharp in nature, that sometimes radiated to mid foot. Endorses first few steps in the morning or after prolonged sitting are the most painful. Pain still present with ambulation, but improved compared to first steps. Resting helps alleviate pain until next attempt at walking. Denies trauma or injury to the area. Denies numbness, tingling, bruising.   Current Problems (verified) Patient Active Problem List   Diagnosis Date Noted  . Need for shingles vaccine 01/15/2016  . Incontinence 02/20/2014  . Dermatitis 01/28/2013  . Leg swelling 05/24/2012  . Knee pain 05/24/2012  . Myalgia 03/25/2011  . HYPERGLYCEMIA 01/24/2011  . ERECTILE DYSFUNCTION, ORGANIC 08/28/2009  . NEOPLASM, MALIGNANT, BLADDER, HX OF 07/18/2008  . Hyperlipidemia 11/30/2007  . Essential hypertension 11/30/2007  . BACK PAIN 07/04/2007    Medications Prior to Visit Current Outpatient Prescriptions on File Prior to Visit  Medication Sig Dispense Refill  . aspirin 81 MG tablet Take 81 mg by mouth daily.      . Cholecalciferol (VITAMIN D3) 1000 UNITS tablet Take 1,000 Units by mouth daily.      . Coenzyme Q10 (CO Q 10) 100 MG CAPS Take 100 mg by mouth daily.      Marland Kitchen lisinopril-hydrochlorothiazide (PRINZIDE,ZESTORETIC) 20-25 MG tablet TAKE 1 TABLET EVERY DAY 90 tablet 0  . NON FORMULARY Cherry Extract as needed for Gout    . simvastatin (ZOCOR) 40 MG tablet TAKE 1 TABLET AT BEDTIME 90 tablet 0  . zoster vaccine live, PF, (ZOSTAVAX) 65784 UNT/0.65ML injection Inject 19,400 Units into the skin once. (Patient not taking: Reported on 05/27/2016) 1 each 0   No current facility-administered medications on file prior to  visit.    Current Medications (verified) Current Outpatient Prescriptions  Medication Sig Dispense Refill  . aspirin 81 MG tablet Take 81 mg by mouth daily.      . Cholecalciferol (VITAMIN D3) 1000 UNITS tablet Take 1,000 Units by mouth daily.      . Coenzyme Q10 (CO Q 10) 100 MG CAPS Take 100 mg by mouth daily.      Marland Kitchen lisinopril-hydrochlorothiazide (PRINZIDE,ZESTORETIC) 20-25 MG tablet TAKE 1 TABLET EVERY DAY 90 tablet 0  . NON FORMULARY Cherry Extract as needed for Gout    . simvastatin (ZOCOR) 40 MG tablet TAKE 1 TABLET AT BEDTIME 90 tablet 0  . zoster vaccine live, PF, (ZOSTAVAX) 69629 UNT/0.65ML injection Inject 19,400 Units into the skin once. (Patient not taking: Reported on 05/27/2016) 1 each 0   No current facility-administered medications for this visit.     Allergies (verified) Review of patient's allergies indicates no known allergies.   PAST HISTORY  Family History Family History  Problem Relation Age of Onset  . Coronary artery disease Father 38    Deceased  . Hypertension Mother     Living  . Hypertension Father   . Diabetes Brother     borderline #1  . Alzheimer's disease Father   . Stroke Paternal Grandfather   . Heart attack Maternal Grandfather   . Arthritis Other   . Hypertension Brother     #2  . Heart disease Brother     #2  . Healthy Son  x1    Social History Social History  Substance Use Topics  . Smoking status: Never Smoker   . Smokeless tobacco: Never Used  . Alcohol Use: 4.2 oz/week    7 Cans of beer per week    Are there smokers in your home (other than you)?  No  Risk Factors Current exercise habits: Home exercise routine includes walking daily, 30 push ups, sit ups per day. Is also physically active at work.  Dietary issues discussed: Body mass index is 32.64 kg/(m^2). Endorses well-balanced diet overall. Is watching portion sizes.   Cardiac risk factors: advanced age (older than 6 for men, 2 for women), dyslipidemia,  hypertension, male gender and obesity (BMI >= 30 kg/m2).  Depression Screen (Note: if answer to either of the following is "Yes", a more complete depression screening is indicated)   Q1: Over the past two weeks, have you felt down, depressed or hopeless? No  Q2: Over the past two weeks, have you felt little interest or pleasure in doing things? No  Have you lost interest or pleasure in daily life? No  Do you often feel hopeless? No  Do you cry easily over simple problems? No  Activities of Daily Living In your present state of health, do you have any difficulty performing the following activities?:  Driving? No Managing money?  No Feeding yourself? No Getting from bed to chair? No Climbing a flight of stairs? No Preparing food and eating?: No Bathing or showering? No Getting dressed: No Getting to the toilet? No Using the toilet:No Moving around from place to place: No In the past year have you fallen or had a near fall?:No   Are you sexually active?  No  Do you have more than one partner?  N/A  Hearing Difficulties: No Do you often ask people to speak up or repeat themselves? No Do you experience ringing or noises in your ears? No Do you have difficulty understanding soft or whispered voices? No   Do you feel that you have a problem with memory? No  Do you often misplace items? No  Do you feel safe at home?  Yes  Cognitive Testing  Alert? Yes  Normal Appearance?Yes  Oriented to person? Yes  Place? Yes   Time? Yes  Recall of three objects?  Yes  Can perform simple calculations? Yes  Displays appropriate judgment?Yes  Can read the correct time from a watch face?Yes   Advanced Directives have been discussed with the patient? Yes   List the Names of Other Physician/Practitioners you currently use: 1.  See EMR for Comprehensive List.  Indicate any recent Medical Services you may have received from other than [facility] providers in the past year (date may be  approximate).  Immunization History  Administered Date(s) Administered  . Influenza Whole 11/30/2007, 08/28/2009  . Influenza-Unspecified 04/05/2015  . Pneumococcal Conjugate-13 02/24/2015  . Td 05/22/2006    Screening Tests Health Maintenance  Topic Date Due  . Hepatitis C Screening  1951/08/22  . ZOSTAVAX  01/12/2010  . PNA vac Low Risk Adult (2 of 2 - PPSV23) 02/24/2016  . COLONOSCOPY  04/24/2016  . TETANUS/TDAP  05/22/2016  . INFLUENZA VACCINE  07/05/2016  . DTaP/Tdap/Td  Completed    All answers were reviewed with the patient and necessary referrals were made:  Leeanne Rio, PA-C   05/27/2016   History reviewed: allergies, current medications, past family history, past medical history, past social history, past surgical history and problem list  Review of  Systems Pertinent items noted in HPI and remainder of comprehensive ROS otherwise negative.    Objective:      Blood pressure 120/72, pulse 64, temperature 98 F (36.7 C), temperature source Oral, resp. rate 16, height 6\' 1"  (1.854 m), weight 247 lb 6 oz (112.209 kg), SpO2 98 %. Body mass index is 32.64 kg/(m^2).  General appearance: alert, cooperative, appears stated age and no distress Head: Normocephalic, without obvious abnormality, atraumatic Ears: normal TM's and external ear canals both ears Nose: Nares normal. Septum midline. Mucosa normal. No drainage or sinus tenderness. Throat: lips, mucosa, and tongue normal; teeth and gums normal Lungs: clear to auscultation bilaterally Heart: regular rate and rhythm, S1, S2 normal, no murmur, click, rub or gallop Extremities: extremities normal, atraumatic, no cyanosis or edema and tenderness with palpation over the plantar fascia of left heel. ROM intact without pain. No gross or bony abnormality noted on examination Pulses: 2+ and symmetric Neurologic: Alert and oriented X 3, normal strength and tone. Normal symmetric reflexes. Normal coordination and  gait     Assessment:     (1) Medicare Wellness, Subsequent (2) Hypertension (3) Hyperlipidemia (4) Plantar Fasciitis, Left     Plan:     (1) During the course of the visit the patient was educated and counseled about appropriate screening and preventive services including:    Pneumococcal vaccine - Pneumovax to be given by nursing staff  Prostate cancer screening - Pt defers to Urologist.  Colorectal cancer screening - Order placed for screening colonoscopy as patient is overdue.  Diabetes screening  Nutrition counseling   Zostavax - Rx printed. Patient to check coverage with Medicare.  (2) BP well-controlled. Asymptomatic. Will check metabolic panel. Continue current regimen and DASH Diet. FU 6 months.  (3) Will check fasting lipid panel today.  (4) Supportive measures and OTC medications reviewed. He is to get shoes with good arch support to begin wearing. Declines Rx pain medication. Recommend Sports Medicine or Podiatry if symptoms are not improving with conservative measures.  Patient Instructions (the written plan) was given to the patient.  Medicare Attestation I have personally reviewed: The patient's medical and social history Their use of alcohol, tobacco or illicit drugs Their current medications and supplements The patient's functional ability including ADLs,fall risks, home safety risks, cognitive, and hearing and visual impairment Diet and physical activities Evidence for depression or mood disorders  The patient's weight, height, BMI, and visual acuity have been recorded in the chart.  I have made referrals, counseling, and provided education to the patient based on review of the above and I have provided the patient with a written personalized care plan for preventive services.     Raiford Noble [place], [place]   05/27/2016

## 2016-05-27 NOTE — Progress Notes (Signed)
Pre visit review using our clinic review tool, if applicable. No additional management support is needed unless otherwise documented below in the visit note/SLS  

## 2016-05-30 ENCOUNTER — Other Ambulatory Visit (INDEPENDENT_AMBULATORY_CARE_PROVIDER_SITE_OTHER): Payer: Medicare Other

## 2016-05-30 DIAGNOSIS — R739 Hyperglycemia, unspecified: Secondary | ICD-10-CM

## 2016-05-30 LAB — HEMOGLOBIN A1C: HEMOGLOBIN A1C: 6 % (ref 4.6–6.5)

## 2016-05-31 ENCOUNTER — Encounter: Payer: Self-pay | Admitting: Family

## 2016-05-31 MED ORDER — CIPROFLOXACIN HCL 250 MG PO TABS: 250.0000 mg | ORAL_TABLET | Freq: Two times a day (BID) | ORAL | Status: DC

## 2016-05-31 NOTE — Telephone Encounter (Signed)
Sugar is in borderline dm range. (cody's patient) Looks like he may have mild uti- will rx with cipro bid. Microscopic blood in urine, repeat ua with micro in 2 weeks, dx microscopic hematuria.  Cholesterol, blood count, kidney function all stable.

## 2016-05-31 NOTE — Telephone Encounter (Signed)
Notified pt of below recommendation. Pt states he has a neobladder. Please advise if he should still take abx and f/u urine or other recommendation?

## 2016-06-01 ENCOUNTER — Other Ambulatory Visit: Payer: Self-pay | Admitting: Physician Assistant

## 2016-06-01 DIAGNOSIS — R3129 Other microscopic hematuria: Secondary | ICD-10-CM

## 2016-06-01 NOTE — Telephone Encounter (Signed)
Mychart message sent to pt.     Debbrah Alar, NP    Sent: Wed June 01, 2016 7:04 AM    To: [name], [name]        Message     Yes please.

## 2016-06-21 ENCOUNTER — Other Ambulatory Visit: Payer: Medicare Other

## 2016-06-21 DIAGNOSIS — R3129 Other microscopic hematuria: Secondary | ICD-10-CM | POA: Diagnosis not present

## 2016-06-22 LAB — URINALYSIS, ROUTINE W REFLEX MICROSCOPIC
Bilirubin Urine: NEGATIVE
GLUCOSE, UA: NEGATIVE
Ketones, ur: NEGATIVE
LEUKOCYTES UA: NEGATIVE
Nitrite: NEGATIVE
PH: 7 (ref 5.0–8.0)
Protein, ur: NEGATIVE
SPECIFIC GRAVITY, URINE: 1.008 (ref 1.001–1.035)

## 2016-06-22 LAB — URINALYSIS, MICROSCOPIC ONLY
CASTS: NONE SEEN [LPF]
Crystals: NONE SEEN [HPF]
RBC / HPF: NONE SEEN RBC/HPF (ref <=2)
YEAST: NONE SEEN [HPF]

## 2016-09-07 DIAGNOSIS — J00 Acute nasopharyngitis [common cold]: Secondary | ICD-10-CM | POA: Diagnosis not present

## 2016-09-07 DIAGNOSIS — R05 Cough: Secondary | ICD-10-CM | POA: Diagnosis not present

## 2016-09-29 DIAGNOSIS — J209 Acute bronchitis, unspecified: Secondary | ICD-10-CM | POA: Diagnosis not present

## 2016-10-17 ENCOUNTER — Other Ambulatory Visit: Payer: Self-pay | Admitting: Physician Assistant

## 2017-01-02 ENCOUNTER — Encounter: Payer: Self-pay | Admitting: Medical

## 2017-01-02 ENCOUNTER — Ambulatory Visit (INDEPENDENT_AMBULATORY_CARE_PROVIDER_SITE_OTHER): Payer: Medicare Other | Admitting: Medical

## 2017-01-02 VITALS — BP 140/80 | HR 66 | Temp 98.0°F | Resp 16 | Ht 73.0 in | Wt 253.5 lb

## 2017-01-02 DIAGNOSIS — Z1211 Encounter for screening for malignant neoplasm of colon: Secondary | ICD-10-CM | POA: Diagnosis not present

## 2017-01-02 DIAGNOSIS — Z23 Encounter for immunization: Secondary | ICD-10-CM

## 2017-01-02 DIAGNOSIS — L989 Disorder of the skin and subcutaneous tissue, unspecified: Secondary | ICD-10-CM | POA: Diagnosis not present

## 2017-01-02 DIAGNOSIS — Z0000 Encounter for general adult medical examination without abnormal findings: Secondary | ICD-10-CM | POA: Diagnosis not present

## 2017-01-02 DIAGNOSIS — Z1159 Encounter for screening for other viral diseases: Secondary | ICD-10-CM | POA: Diagnosis not present

## 2017-01-02 DIAGNOSIS — R5383 Other fatigue: Secondary | ICD-10-CM | POA: Diagnosis not present

## 2017-01-02 DIAGNOSIS — Z114 Encounter for screening for human immunodeficiency virus [HIV]: Secondary | ICD-10-CM

## 2017-01-02 DIAGNOSIS — I10 Essential (primary) hypertension: Secondary | ICD-10-CM

## 2017-01-02 LAB — COMPLETE METABOLIC PANEL WITH GFR
ALBUMIN: 4.4 g/dL (ref 3.6–5.1)
ALK PHOS: 38 U/L — AB (ref 40–115)
ALT: 32 U/L (ref 9–46)
AST: 23 U/L (ref 10–35)
BUN: 21 mg/dL (ref 7–25)
CALCIUM: 9.4 mg/dL (ref 8.6–10.3)
CO2: 26 mmol/L (ref 20–31)
Chloride: 100 mmol/L (ref 98–110)
Creat: 0.84 mg/dL (ref 0.70–1.25)
Glucose, Bld: 100 mg/dL — ABNORMAL HIGH (ref 65–99)
POTASSIUM: 4.3 mmol/L (ref 3.5–5.3)
Sodium: 138 mmol/L (ref 135–146)
Total Bilirubin: 0.8 mg/dL (ref 0.2–1.2)
Total Protein: 7 g/dL (ref 6.1–8.1)

## 2017-01-02 LAB — POC URINALSYSI DIPSTICK (AUTOMATED)
Bilirubin, UA: NEGATIVE
Blood, UA: NEGATIVE
Glucose, UA: NEGATIVE
KETONES UA: NEGATIVE
LEUKOCYTES UA: NEGATIVE
Nitrite, UA: NEGATIVE
PH UA: 6.5
PROTEIN UA: NEGATIVE
Spec Grav, UA: 1.01
UROBILINOGEN UA: 0.2

## 2017-01-02 NOTE — Progress Notes (Signed)
Pre visit review using our clinic review tool, if applicable. No additional management support is needed unless otherwise documented below in the visit note/SLS  

## 2017-01-02 NOTE — Progress Notes (Signed)
Subjective:    Patient ID: Gregory Tate, male    DOB: 25-Sep-1951, 66 y.o.   MRN: [id number]  HPI  I have reviewed pt PMH, PSH, FH, Social History and Surgical History. Pt skipped his annual physical last year-  Pt due for colonoscopy- Past due.(prefer to [facility]).  Due for tetanus-will get tdap today.  Denied flu vaccine today-counseled but declined by pt.  Skin tags or dry spots. Pt not sure.   Trying to lose weight-Pt has been exercising and on weight loss program. Isogenic. Pt takes a shake.   Hx of htn. Has been controlled in past per pt. No cardiac or neurologic signs or symptoms.   Review of Systems  Constitutional: Positive for fatigue. Negative for chills and fever.  HENT: Negative for congestion, ear discharge and ear pain.   Respiratory: Negative for cough, chest tightness, shortness of breath and wheezing.   Cardiovascular: Negative for chest pain and palpitations.  Gastrointestinal: Negative for abdominal pain, blood in stool, constipation, diarrhea, nausea and vomiting.  Genitourinary: Positive for frequency. Negative for decreased urine volume, dysuria, flank pain, genital sores, hematuria, penile pain, penile swelling, scrotal swelling and testicular pain.       Gets up at night frequently. Uses bathroom about every 2 hours. Pt has been seeing urologist for 15 years. Pt states has not seen urologist for 5 years.  Pt states used to see [place]. He released pt 5 years ago. Hx of bladder cancer.  Musculoskeletal: Negative for back pain.  Skin: Negative for rash.  Hematological: Negative for adenopathy. Does not bruise/bleed easily.  Psychiatric/Behavioral: Negative for behavioral problems, confusion and hallucinations.    Past Medical History:  Diagnosis Date  . Chicken pox   . Dermatitis 01/28/2013  . Fatty liver    mild  . History of bladder cancer 2002   history of gross hematuria  secondary ro invasive bladder cancer - transitional cell carcinoma of  bladder stage T3 NO MX  . Hyperlipidemia   . Hypertension   . Mumps      Social History   Social History  . Marital status: Married    Spouse name: N/A  . Number of children: N/A  . Years of education: N/A   Occupational History  . Not on file.   Social History Main Topics  . Smoking status: Never Smoker  . Smokeless tobacco: Never Used  . Alcohol use 4.2 oz/week    7 Cans of beer per week  . Drug use: No  . Sexual activity: Not on file   Other Topics Concern  . 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      [place]   Married for 49 year    one son 75    New grandchild   Alcohol use-yes (social)  < 14 drinks per week.    Past Surgical History:  Procedure Laterality Date  . COLONOSCOPY  2007  . TONSILLECTOMY    . URINARY DIVERSION  2002   radical cystoprostatecomy and urinary diversion     Family History  Problem Relation Age of Onset  . Coronary artery disease Father 47    Deceased  . Hypertension Mother     Living  . Hypertension Father   . Diabetes Brother     borderline #1  . Alzheimer's disease Father   . Stroke Paternal Grandfather   . Heart  attack Maternal Grandfather   . Arthritis Other   . Hypertension Brother     #2  . Heart disease Brother     #2  . Healthy Son     x1    No Known Allergies  Current Outpatient Prescriptions on File Prior to Visit  Medication Sig Dispense Refill  . aspirin 81 MG tablet Take 81 mg by mouth daily.      . Cholecalciferol (VITAMIN D3) 1000 UNITS tablet Take 1,000 Units by mouth daily.      . Coenzyme Q10 (CO Q 10) 100 MG CAPS Take 100 mg by mouth daily.      Marland Kitchen lisinopril-hydrochlorothiazide (PRINZIDE,ZESTORETIC) 20-25 MG tablet TAKE 1 TABLET EVERY DAY 90 tablet 1  . NON FORMULARY Cherry Extract as needed for Gout    . simvastatin (ZOCOR) 40 MG tablet TAKE 1 TABLET (40 MG TOTAL) BY MOUTH AT BEDTIME. 90 tablet 1  .  zoster vaccine live, PF, (ZOSTAVAX) 09811 UNT/0.65ML injection Inject 19,400 Units into the skin once. (Patient not taking: Reported on 05/27/2016) 1 each 0   No current facility-administered medications on file prior to visit.     BP (!) 148/87 (BP Location: Left Arm, Patient Position: Sitting, Cuff Size: Large)   Pulse 66   Temp 98 F (36.7 C) (Oral)   Resp 16   Ht 6\' 1"  (1.854 m)   Wt 253 lb 8 oz (115 kg)   SpO2 100%   BMI 33.45 kg/m       Objective:   Physical Exam  General  Mental Status - Alert. General Appearance - Well groomed. Not in acute distress.  Skin Rashes- No Rashes.  HEENT Head- Normal. Ear Auditory Canal - Left- Normal. Right - Normal.Tympanic Membrane- Left- Normal. Right- Normal. Eye Sclera/Conjunctiva- Left- Normal. Right- Normal. Nose & Sinuses Nasal Mucosa- Left-  Not Boggy and Congested. Right-  Not Boggy and  Congested.Bilateral no  maxillary and  No frontal sinus pressure. Mouth & Throat Lips: Upper Lip- Normal: no dryness, cracking, pallor, cyanosis, or vesicular eruption. Lower Lip-Normal: no dryness, cracking, pallor, cyanosis or vesicular eruption. Buccal Mucosa- Bilateral- No Aphthous ulcers. Oropharynx- No Discharge or Erythema. Tonsils: Characteristics- Bilateral- No Erythema or Congestion. Size/Enlargement- Bilateral- No enlargement. Discharge- bilateral-None.  Neck Neck- Supple. No Masses.   Chest and Lung Exam Auscultation: Breath Sounds:-Clear even and unlabored.  Cardiovascular Auscultation:Rythm- Regular, rate and rhythm. Murmurs & Other Heart Sounds:Ausculatation of the heart reveal- No Murmurs.  Lymphatic Head & Neck General Head & Neck Lymphatics: Bilateral: Description- No Localized lymphadenopathy.  Abdomen- soft, no-tender, non-distended, +bs, no rebound or guarding.   Back- no cva tenderness.  Skin- scattered small moles on his back. Rt side back about 5 seborrheic keratosis. Other moles moderate sized.         Assessment & Plan:  For you wellness exam today I have ordered cbc, cmp, tsh, lipid panel, ua and hiv.  Vaccine given today. Can give/order new shingles vaccines in late spring. Tdap today.  Psv 23 today also.  Recommend exercise and healthy diet.  We will let you know lab results as they come in.  Follow up date appointment will be determined after lab review.   BP borderline recently and will follow near future. Make adjustment if on follow up bp worse.   Chalee Hirota, Iris Pert   Zian Mohamed, Percell Miller, PA-C

## 2017-01-02 NOTE — Patient Instructions (Addendum)
For you wellness exam today I have ordered cbc, cmp, tsh, lipid panel, ua and hiv.  Vaccine given today. Can give/order new shingles vaccines in late spring. Tdap today.  Psv 23 today also.  Recommend exercise and healthy diet.  We will let you know lab results as they come in.  Follow up date appointment will be determined after lab review.   BP borderline recently and will follow near future. Make adjustment if on follow up bp worse.   Preventive Care 1 Years and Older, Male Preventive care refers to lifestyle choices and visits with your health care provider that can promote health and wellness. What does preventive care include?  A yearly physical exam. This is also called an annual well check.  Dental exams once or twice a year.  Routine eye exams. Ask your health care provider how often you should have your eyes checked.  Personal lifestyle choices, including:  Daily care of your teeth and gums.  Regular physical activity.  Eating a healthy diet.  Avoiding tobacco and drug use.  Limiting alcohol use.  Practicing safe sex.  Taking low doses of aspirin every day.  Taking vitamin and mineral supplements as recommended by your health care provider. What happens during an annual well check? The services and screenings done by your health care provider during your annual well check will depend on your age, overall health, lifestyle risk factors, and family history of disease. Counseling  Your health care provider may ask you questions about your:  Alcohol use.  Tobacco use.  Drug use.  Emotional well-being.  Home and relationship well-being.  Sexual activity.  Eating habits.  History of falls.  Memory and ability to understand (cognition).  Work and work Statistician. Screening  You may have the following tests or measurements:  Height, weight, and BMI.  Blood pressure.  Lipid and cholesterol levels. These may be checked every 5 years, or more  frequently if you are over 44 years old.  Skin check.  Lung cancer screening. You may have this screening every year starting at age 32 if you have a 30-pack-year history of smoking and currently smoke or have quit within the past 15 years.  Fecal occult blood test (FOBT) of the stool. You may have this test every year starting at age 60.  Flexible sigmoidoscopy or colonoscopy. You may have a sigmoidoscopy every 5 years or a colonoscopy every 10 years starting at age 89.  Prostate cancer screening. Recommendations will vary depending on your family history and other risks.  Hepatitis C blood test.  Hepatitis B blood test.  Sexually transmitted disease (STD) testing.  Diabetes screening. This is done by checking your blood sugar (glucose) after you have not eaten for a while (fasting). You may have this done every 1-3 years.  Abdominal aortic aneurysm (AAA) screening. You may need this if you are a current or former smoker.  Osteoporosis. You may be screened starting at age 86 if you are at high risk. Talk with your health care provider about your test results, treatment options, and if necessary, the need for more tests. Vaccines  Your health care provider may recommend certain vaccines, such as:  Influenza vaccine. This is recommended every year.  Tetanus, diphtheria, and acellular pertussis (Tdap, Td) vaccine. You may need a Td booster every 10 years.  Varicella vaccine. You may need this if you have not been vaccinated.  Zoster vaccine. You may need this after age 41.  Measles, mumps, and rubella (MMR)  vaccine. You may need at least one dose of MMR if you were born in 1957 or later. You may also need a second dose.  Pneumococcal 13-valent conjugate (PCV13) vaccine. One dose is recommended after age 4.  Pneumococcal polysaccharide (PPSV23) vaccine. One dose is recommended after age 60.  Meningococcal vaccine. You may need this if you have certain conditions.  Hepatitis A  vaccine. You may need this if you have certain conditions or if you travel or work in places where you may be exposed to hepatitis A.  Hepatitis B vaccine. You may need this if you have certain conditions or if you travel or work in places where you may be exposed to hepatitis B.  Haemophilus influenzae type b (Hib) vaccine. You may need this if you have certain risk factors. Talk to your health care provider about which screenings and vaccines you need and how often you need them. This information is not intended to replace advice given to you by your health care provider. Make sure you discuss any questions you have with your health care provider. Document Released: 12/18/2015 Document Revised: 08/10/2016 Document Reviewed: 09/22/2015 Elsevier Interactive Patient Education  2017 [employer].

## 2017-01-03 ENCOUNTER — Encounter: Payer: Self-pay | Admitting: Medical

## 2017-01-03 LAB — LIPID PANEL
CHOL/HDL RATIO: 3
CHOLESTEROL: 174 mg/dL (ref 0–200)
HDL: 50.8 mg/dL (ref >39.00)
LDL Cholesterol: 91 mg/dL (ref 0–99)
NonHDL: 123.2
Triglycerides: 160 mg/dL — ABNORMAL HIGH (ref 0.0–149.0)
VLDL: 32 mg/dL (ref 0.0–40.0)

## 2017-01-03 LAB — CBC WITH DIFFERENTIAL/PLATELET
BASOS ABS: 0 10*3/uL (ref 0.0–0.1)
Basophils Relative: 0.6 % (ref 0.0–3.0)
EOS ABS: 0.2 10*3/uL (ref 0.0–0.7)
Eosinophils Relative: 2.6 % (ref 0.0–5.0)
HEMATOCRIT: 47.3 % (ref 39.0–52.0)
Hemoglobin: 16.3 g/dL (ref 13.0–17.0)
LYMPHS PCT: 35.7 % (ref 12.0–46.0)
Lymphs Abs: 2.6 10*3/uL (ref 0.7–4.0)
MCHC: 34.5 g/dL (ref 30.0–36.0)
MCV: 88.8 fl (ref 78.0–100.0)
MONOS PCT: 7.5 % (ref 3.0–12.0)
Monocytes Absolute: 0.5 10*3/uL (ref 0.1–1.0)
NEUTROS PCT: 53.6 % (ref 43.0–77.0)
Neutro Abs: 3.8 10*3/uL (ref 1.4–7.7)
Platelets: 178 10*3/uL (ref 150.0–400.0)
RBC: 5.33 Mil/uL (ref 4.22–5.81)
RDW: 12.9 % (ref 11.5–15.5)
WBC: 7.2 10*3/uL (ref 4.0–10.5)

## 2017-01-03 LAB — TSH: TSH: 1.66 u[IU]/mL (ref 0.35–4.50)

## 2017-01-03 LAB — HEPATITIS C ANTIBODY: HCV Ab: NEGATIVE

## 2017-01-03 LAB — HIV ANTIBODY (ROUTINE TESTING W REFLEX): HIV 1&2 Ab, 4th Generation: NONREACTIVE

## 2017-01-04 ENCOUNTER — Other Ambulatory Visit: Payer: Self-pay | Admitting: *Deleted

## 2017-01-04 DIAGNOSIS — E785 Hyperlipidemia, unspecified: Secondary | ICD-10-CM

## 2017-01-04 DIAGNOSIS — I10 Essential (primary) hypertension: Secondary | ICD-10-CM

## 2017-01-17 DIAGNOSIS — K640 First degree hemorrhoids: Secondary | ICD-10-CM | POA: Diagnosis not present

## 2017-01-17 DIAGNOSIS — Z1211 Encounter for screening for malignant neoplasm of colon: Secondary | ICD-10-CM | POA: Diagnosis not present

## 2017-01-17 LAB — HM COLONOSCOPY

## 2017-01-30 DIAGNOSIS — Z1211 Encounter for screening for malignant neoplasm of colon: Secondary | ICD-10-CM | POA: Insufficient documentation

## 2017-01-31 ENCOUNTER — Telehealth: Payer: Self-pay | Admitting: Medical

## 2017-01-31 NOTE — Telephone Encounter (Signed)
Recent colonscopy done was negative with no polyps. Repeat in 10 years done at [facility]. Will you abstract that.

## 2017-01-31 NOTE — Telephone Encounter (Signed)
I will need the date of procedure for documentation. Thanks/SLS 02/27

## 2017-01-31 NOTE — Telephone Encounter (Signed)
01-17-2017 date of procedure.

## 2017-02-01 NOTE — Telephone Encounter (Signed)
Documentation entered in Health Maintenance [and quick abstraction]/SLS 02/28

## 2017-03-10 ENCOUNTER — Other Ambulatory Visit: Payer: Self-pay | Admitting: Physician Assistant

## 2017-03-11 NOTE — Telephone Encounter (Signed)
I sent in pt bp med and lipid medication. Her blood pressure was high on last visit. Want her to follow up in 2 weeks for nurse blood pressure check. See if still high.

## 2017-03-17 ENCOUNTER — Telehealth: Payer: Self-pay | Admitting: Medical

## 2017-03-17 NOTE — Telephone Encounter (Addendum)
Pt stated that he hasn't ben seen in the office since January he has the medication prescribed. Pt refused to come in for BP check.  Gregory Tate is note below  for this pt?. Pt has not been seen since January. Pt is asking why are we contacting him now.

## 2017-03-17 NOTE — Telephone Encounter (Signed)
HIs bp was high on last visit with me. You filled his meds recently. Based on his last bp would recommend follow up early June. At the latest July. That would be 5 or 6 months. See if he will agree to one of those dates. Let me know what he says.

## 2017-03-21 NOTE — Telephone Encounter (Signed)
Scheduled pt to have blood pressure check by RN June 8. LB

## 2017-04-25 ENCOUNTER — Encounter: Payer: Self-pay | Admitting: Medical

## 2017-04-25 ENCOUNTER — Ambulatory Visit (INDEPENDENT_AMBULATORY_CARE_PROVIDER_SITE_OTHER): Payer: Medicare Other | Admitting: Medical

## 2017-04-25 ENCOUNTER — Ambulatory Visit (HOSPITAL_BASED_OUTPATIENT_CLINIC_OR_DEPARTMENT_OTHER)
Admission: RE | Admit: 2017-04-25 | Discharge: 2017-04-25 | Disposition: A | Discharge status: Home / Self Care | Payer: Medicare Other | Source: Ambulatory Visit | Attending: Medical | Admitting: Medical

## 2017-04-25 VITALS — BP 135/80 | HR 74 | Temp 97.9°F | Resp 16 | Ht 73.0 in | Wt 252.6 lb

## 2017-04-25 DIAGNOSIS — I10 Essential (primary) hypertension: Secondary | ICD-10-CM

## 2017-04-25 DIAGNOSIS — M25562 Pain in left knee: Secondary | ICD-10-CM | POA: Insufficient documentation

## 2017-04-25 DIAGNOSIS — G8929 Other chronic pain: Secondary | ICD-10-CM

## 2017-04-25 DIAGNOSIS — S8992XA Unspecified injury of left lower leg, initial encounter: Secondary | ICD-10-CM | POA: Diagnosis not present

## 2017-04-25 NOTE — Patient Instructions (Addendum)
For your left knee pain for 2 months will get xray. Can continue low dose ibuprofen or alleve. I will go ahead and refer you to sports medicine as you pain has been present now for months.   Your htn/bp level  is better today. Continue current bp medication.   Follow up September early am appointment. Recommend come in fasting for blood work. Check lipids, cmp, and a1-c at that time. Also good time to get flu vaccine.
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
## 2017-04-26 ENCOUNTER — Ambulatory Visit (INDEPENDENT_AMBULATORY_CARE_PROVIDER_SITE_OTHER): Payer: Medicare Other | Admitting: Family Medicine

## 2017-04-26 ENCOUNTER — Encounter: Payer: Self-pay | Admitting: Family Medicine

## 2017-04-26 DIAGNOSIS — M25562 Pain in left knee: Secondary | ICD-10-CM

## 2017-04-26 DIAGNOSIS — G8929 Other chronic pain: Secondary | ICD-10-CM | POA: Diagnosis not present

## 2017-04-26 NOTE — Patient Instructions (Signed)
Your pain is either due to minimal arthritis (not seen on x-rays) or a small meniscus tear. Both are treated similarly. These are the different medications you can take for this: Tylenol 500mg  1-2 tabs three times a day for pain. Aleve 1-2 tabs twice a day with food Capsaicin, aspercreme, or biofreeze topically up to four times a day may also help with pain. Some supplements that may help: Boswellia extract, curcumin, pycnogenol Cortisone injections are an option if pain is severe. It's important that you continue to stay active. Straight leg raises, straight leg raises with foot turned outwards, knee extensions, hamstring curls 3 sets of 10 once a day (add ankle weight if these become too easy). Consider physical therapy to strengthen muscles around the joint that hurts to take pressure off of the joint itself. Shoe inserts with good arch support may be helpful. Heat or ice 15 minutes at a time 3-4 times a day as needed to help with pain. Follow up with me in 5-6 weeks (or as needed if you're doing well).
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
## 2017-04-30 NOTE — Assessment & Plan Note (Signed)
independently reviewed radiographs and no abnormalities though not weight bearing.  Exam is reassuring.  Discussed pain is likely due to minimal arthritis not seen on nonweightbearing radiographs vs small meniscus tear.  Would treat similarly for both.  Discussed tylenol, aleve, topical medications, supplements that may help.  Shown home exercises to do daily.  Declined injection, physical therapy for now.  F/u in 5-6 weeks.

## 2017-06-16 ENCOUNTER — Ambulatory Visit: Payer: Medicare Other | Admitting: Medical

## 2017-06-16 ENCOUNTER — Telehealth: Payer: Self-pay | Admitting: Medical

## 2017-06-16 DIAGNOSIS — B028 Zoster with other complications: Secondary | ICD-10-CM | POA: Diagnosis not present

## 2017-06-16 DIAGNOSIS — B0233 Zoster keratitis: Secondary | ICD-10-CM | POA: Diagnosis not present

## 2017-06-16 DIAGNOSIS — B0239 Other herpes zoster eye disease: Secondary | ICD-10-CM | POA: Diagnosis not present

## 2017-06-16 NOTE — Telephone Encounter (Signed)
Please disregard message sent in erro

## 2017-06-16 NOTE — Telephone Encounter (Signed)
Caller name: Relation to UX:YBFX Call back number:[phone] Pharmacy:  Reason for call:  Pt is

## 2017-06-23 DIAGNOSIS — H0233 Blepharochalasis right eye, unspecified eyelid: Secondary | ICD-10-CM | POA: Diagnosis not present

## 2017-06-23 DIAGNOSIS — B028 Zoster with other complications: Secondary | ICD-10-CM | POA: Diagnosis not present

## 2017-06-23 DIAGNOSIS — H0232 Blepharochalasis right lower eyelid: Secondary | ICD-10-CM | POA: Diagnosis not present

## 2017-06-28 DIAGNOSIS — B0232 Zoster iridocyclitis: Secondary | ICD-10-CM | POA: Diagnosis not present

## 2017-07-03 ENCOUNTER — Other Ambulatory Visit: Payer: Self-pay

## 2017-07-14 DIAGNOSIS — H43812 Vitreous degeneration, left eye: Secondary | ICD-10-CM | POA: Diagnosis not present

## 2017-07-14 DIAGNOSIS — B0232 Zoster iridocyclitis: Secondary | ICD-10-CM | POA: Diagnosis not present

## 2017-08-05 DIAGNOSIS — B029 Zoster without complications: Secondary | ICD-10-CM

## 2017-08-05 HISTORY — DX: Zoster without complications: B02.9

## 2017-08-23 DIAGNOSIS — H04123 Dry eye syndrome of bilateral lacrimal glands: Secondary | ICD-10-CM | POA: Diagnosis not present

## 2017-08-23 DIAGNOSIS — H524 Presbyopia: Secondary | ICD-10-CM | POA: Diagnosis not present

## 2017-08-23 DIAGNOSIS — H40813 Glaucoma with increased episcleral venous pressure, bilateral: Secondary | ICD-10-CM | POA: Diagnosis not present

## 2017-08-23 DIAGNOSIS — H01001 Unspecified blepharitis right upper eyelid: Secondary | ICD-10-CM | POA: Diagnosis not present

## 2017-08-23 DIAGNOSIS — H01004 Unspecified blepharitis left upper eyelid: Secondary | ICD-10-CM | POA: Diagnosis not present

## 2017-08-24 DIAGNOSIS — M25511 Pain in right shoulder: Secondary | ICD-10-CM | POA: Diagnosis not present

## 2017-08-24 DIAGNOSIS — S43004A Unspecified dislocation of right shoulder joint, initial encounter: Secondary | ICD-10-CM | POA: Diagnosis not present

## 2017-08-28 ENCOUNTER — Ambulatory Visit (INDEPENDENT_AMBULATORY_CARE_PROVIDER_SITE_OTHER): Payer: Medicare Other | Admitting: Medical

## 2017-08-28 ENCOUNTER — Ambulatory Visit (HOSPITAL_BASED_OUTPATIENT_CLINIC_OR_DEPARTMENT_OTHER)
Admission: RE | Admit: 2017-08-28 | Discharge: 2017-08-28 | Disposition: A | Discharge status: Home / Self Care | Payer: Medicare Other | Source: Ambulatory Visit | Attending: Medical | Admitting: Medical

## 2017-08-28 ENCOUNTER — Encounter: Payer: Self-pay | Admitting: Medical

## 2017-08-28 VITALS — BP 135/74 | HR 72 | Temp 98.0°F | Ht 73.0 in | Wt 248.8 lb

## 2017-08-28 DIAGNOSIS — M940 Chondrocostal junction syndrome [Tietze]: Secondary | ICD-10-CM

## 2017-08-28 DIAGNOSIS — R059 Cough, unspecified: Secondary | ICD-10-CM

## 2017-08-28 DIAGNOSIS — S43101A Unspecified dislocation of right acromioclavicular joint, initial encounter: Secondary | ICD-10-CM | POA: Diagnosis not present

## 2017-08-28 DIAGNOSIS — R05 Cough: Secondary | ICD-10-CM

## 2017-08-28 DIAGNOSIS — R079 Chest pain, unspecified: Secondary | ICD-10-CM | POA: Diagnosis not present

## 2017-08-28 DIAGNOSIS — B0229 Other postherpetic nervous system involvement: Secondary | ICD-10-CM

## 2017-08-28 MED ORDER — TRAMADOL HCL 50 MG PO TABS: 50.0000 mg | ORAL_TABLET | Freq: Four times a day (QID) | ORAL | 0 refills | Status: DC | PRN

## 2017-08-28 NOTE — Progress Notes (Signed)
Pre visit review using our clinic review tool, if applicable. No additional management support is needed unless otherwise documented below in the visit note. 

## 2017-08-28 NOTE — Patient Instructions (Addendum)
For [facility] separation see orthopedist this week.  For costochondritis and muscle pain(pectoralis and scapula area) use low dose nsaid otc and tramadol at night. Stop hydrocodone.  For cough will get dg chest xray.   Tramadol may help with nerve pain post shingles(though your symptoms mild)  Please get new shingles.(can discuss with your pharmacist) our inventory is low.  Follow  2 weeks or as needed
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
## 2017-08-31 DIAGNOSIS — M25511 Pain in right shoulder: Secondary | ICD-10-CM | POA: Diagnosis not present

## 2017-09-14 DIAGNOSIS — M25511 Pain in right shoulder: Secondary | ICD-10-CM | POA: Diagnosis not present

## 2017-09-14 DIAGNOSIS — S43109A Unspecified dislocation of unspecified acromioclavicular joint, initial encounter: Secondary | ICD-10-CM | POA: Diagnosis not present

## 2017-09-22 DIAGNOSIS — M25511 Pain in right shoulder: Secondary | ICD-10-CM | POA: Diagnosis not present

## 2017-09-27 DIAGNOSIS — M25511 Pain in right shoulder: Secondary | ICD-10-CM | POA: Diagnosis not present

## 2017-10-05 DIAGNOSIS — M25511 Pain in right shoulder: Secondary | ICD-10-CM | POA: Diagnosis not present

## 2017-10-11 DIAGNOSIS — M25511 Pain in right shoulder: Secondary | ICD-10-CM | POA: Diagnosis not present

## 2017-10-18 DIAGNOSIS — M25511 Pain in right shoulder: Secondary | ICD-10-CM | POA: Diagnosis not present

## 2017-10-28 ENCOUNTER — Other Ambulatory Visit: Payer: Self-pay | Admitting: Medical

## 2017-10-31 ENCOUNTER — Encounter: Payer: Self-pay | Admitting: Medical

## 2017-11-01 MED ORDER — SIMVASTATIN 40 MG PO TABS: 40.0000 mg | ORAL_TABLET | Freq: Every day | ORAL | 1 refills | Status: DC

## 2017-11-01 MED ORDER — LISINOPRIL-HYDROCHLOROTHIAZIDE 20-25 MG PO TABS: 1.0000 | ORAL_TABLET | Freq: Every day | ORAL | 1 refills | Status: DC

## 2017-11-03 DIAGNOSIS — M25511 Pain in right shoulder: Secondary | ICD-10-CM | POA: Diagnosis not present
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
## 2018-01-12 ENCOUNTER — Ambulatory Visit: Payer: Medicare Other | Admitting: Medical

## 2018-01-12 ENCOUNTER — Ambulatory Visit: Payer: Medicare Other | Admitting: *Deleted

## 2018-03-16 ENCOUNTER — Ambulatory Visit: Payer: Medicare Other | Admitting: *Deleted

## 2018-03-16 ENCOUNTER — Ambulatory Visit: Payer: Medicare Other | Admitting: Medical

## 2018-03-22 ENCOUNTER — Ambulatory Visit: Payer: Medicare Other | Admitting: Medical

## 2018-03-25 ENCOUNTER — Encounter: Payer: Self-pay | Admitting: Medical

## 2018-03-27 ENCOUNTER — Ambulatory Visit: Payer: Medicare Other | Admitting: *Deleted

## 2018-03-27 ENCOUNTER — Ambulatory Visit: Payer: Medicare Other | Admitting: Medical

## 2018-03-27 NOTE — Progress Notes (Addendum)
Subjective:   Gregory Tate is a 67 y.o. male who presents for Medicare Annual/Subsequent preventive examination. Still works part time as Forensic scientist.   Review of Systems: No ROS.  Medicare Wellness Visit. Additional risk factors are reflected in the social history.  Cardiac Risk Factors include: advanced age (>49men, >31 women);dyslipidemia;hypertension;male gender Sleep patterns: Wakes 1-2x to urinate. Sleeps 8 hrs. Home Safety/Smoke Alarms: Feels safe in home. Smoke alarms in place.  Living environment; residence and Firearm Safety: Lives in 1 story home with wife and dogs.   Male:   CCS-  01/17/17. 10 yr recall   PSA-  Lab Results  Component Value Date   PSA 0.00 (L) 02/24/2015   PSA <0.01 ng/mL 01/20/2011       Objective:    Vitals: BP 140/78 (BP Location: Left Arm, Patient Position: Sitting, Cuff Size: Normal)   Pulse 69   Ht 6\' 1"  (1.854 m)   Wt 252 lb 9.6 oz (114.6 kg)   SpO2 98%   BMI 33.33 kg/m   Body mass index is 33.33 kg/m.  Advanced Directives 03/29/2018  Does Patient Have a Medical Advance Directive? Yes  Type of Paramedic of [place];Living will  Does patient want to make changes to medical advance directive? No - Patient declined  Copy of [place] in Chart? No - copy requested    Tobacco Social History   Tobacco Use  Smoking Status Never Smoker  Smokeless Tobacco Never Used     Counseling given: Not Answered   Clinical Intake:     Pain : 0-10 Pain Score: 5  Pain Location: Hip Pain Orientation: Right Pain Frequency: Occasional Pain Relieving Factors: BioFreeze Effect of Pain on Daily Activities: Does not affect ADLS  Pain Relieving Factors: BioFreeze              Past Medical History:  Diagnosis Date  . Chicken pox   . Dermatitis 01/28/2013  . Fatty liver    mild  . 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 Hyperlipidemia   . Hypertension   . Mumps   . Shingles 08/05/2017   Past Surgical History:  Procedure Laterality Date  . COLONOSCOPY  2007  . TONSILLECTOMY    . URINARY DIVERSION  2002   radical cystoprostatecomy and urinary diversion    Family History  Problem Relation Age of Onset  . Coronary artery disease Father 27       Deceased  . Hypertension Father   . Alzheimer's disease Father   . Hypertension Mother        Living  . Diabetes Brother        borderline #1  . Stroke Paternal Grandfather   . Heart attack Maternal Grandfather   . Arthritis Other   . Hypertension Brother        #2  . Heart disease Brother        #2  . Healthy Son        x1   Social History   Socioeconomic History  . Marital status: Married    Spouse name: Not on file  . Number of children: Not on file  . Years of education: Not on file  . Highest education level: Not on file  Occupational History  . Not on file  Social Needs  . Financial resource strain: Not  on file  . Food insecurity:    Worry: Not on file    Inability: Not on file  . Transportation needs:    Medical: Not on file    Non-medical: Not on file  Tobacco Use  . Smoking status: Never Smoker  . Smokeless tobacco: Never Used  Substance and Sexual Activity  . Alcohol use: Yes    Alcohol/week: 1.8 oz    Types: 3 Cans of beer per week  . Drug use: No  . Sexual activity: Not Currently  Lifestyle  . Physical activity:    Days per week: Not on file    Minutes per session: Not on file  . Stress: Not on file  Relationships  . Social connections:    Talks on phone: Not on file    Gets together: Not on file    Attends religious service: Not on file    Active member of club or organization: Not on file    Attends meetings of clubs or organizations: Not on file    Relationship status: Not on file  Other Topics Concern  . 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      [place]   Married for 63 year    one son 18    New grandchild   Alcohol use-yes (social)  < 14 drinks per week.    Outpatient Encounter Medications as of 03/29/2018  Medication Sig  . aspirin 81 MG tablet Take 81 mg by mouth daily.    . Cholecalciferol (VITAMIN D3) 1000 UNITS tablet Take 1,000 Units by mouth daily.    . Coenzyme Q10 (CO Q 10) 100 MG CAPS Take 100 mg by mouth daily.    Marland Kitchen lisinopril-hydrochlorothiazide (PRINZIDE,ZESTORETIC) 20-25 MG tablet Take 1 tablet by mouth daily.  . NON FORMULARY Cherry Extract as needed for Gout  . simvastatin (ZOCOR) 40 MG tablet Take 1 tablet (40 mg total) by mouth at bedtime.  Marland Kitchen HYDROcodone-acetaminophen (NORCO) 10-325 MG tablet Take 1 tablet by mouth every 6 (six) hours as needed.  . traMADol (ULTRAM) 50 MG tablet Take 1 tablet (50 mg total) by mouth every 6 (six) hours as needed. (Patient not taking: Reported on 03/29/2018)  . zoster vaccine live, PF, (ZOSTAVAX) 67619 UNT/0.65ML injection Inject 19,400 Units into the skin once. (Patient not taking: Reported on 05/27/2016)   No facility-administered encounter medications on file as of 03/29/2018.     Activities of Daily Living In your present state of health, do you have any difficulty performing the following activities: 03/29/2018  Hearing? N  Vision? N  Comment wears readers.  Difficulty concentrating or making decisions? N  Walking or climbing stairs? N  Dressing or bathing? N  Doing errands, shopping? N  Preparing Food and eating ? N  Using the Toilet? N  In the past six months, have you accidently leaked urine? N  Do you have problems with loss of bowel control? N  Managing your Medications? N  Managing your Finances? N  Housekeeping or managing your Housekeeping? N  Some recent data might be hidden    Patient Care Team: Saguier, Iris Pert as PCP - General (Internal  Medicine) Franchot Gallo, MD as Consulting Physician (Urology) Selina Cooley, MD as Referring Physician (Dermatology)   Assessment:   This is a routine wellness examination for Gregory Tate. Physical assessment deferred to PCP.  Exercise Activities and Dietary recommendations Current  Exercise Habits: Home exercise routine(walks 7000 steps at least each day.), Intensity: Mild, Exercise limited by: None identified Diet (meal preparation, eat out, water intake, caffeinated beverages, dairy products, fruits and vegetables): well balanced. Pt reports he needs to drink more water.    Goals    . DIET - INCREASE WATER INTAKE       Fall Risk Fall Risk  03/29/2018 07/03/2017 01/15/2016 02/24/2015  Falls in the past year? No No No No  Comment - Emmi Telephone Survey: data to providers prior to load - -    Depression Screen PHQ 2/9 Scores 03/29/2018 01/15/2016  PHQ - 2 Score 0 0    Cognitive Function Ad8 score reviewed for issues:  Issues making decisions:no  Less interest in hobbies / activities:no  Repeats questions, stories (family complaining):no  Trouble using ordinary gadgets (microwave, computer, phone):no  Forgets the month or year: no  Mismanaging finances: no  Remembering appts:no Daily problems with thinking and/or memory:no Ad8 score is=0         Immunization History  Administered Date(s) Administered  . Influenza Whole 11/30/2007, 08/28/2009  . Influenza-Unspecified 04/05/2015  . Pneumococcal Conjugate-13 02/24/2015  . Pneumococcal Polysaccharide-23 01/02/2017  . Td 05/22/2006  . Tdap 01/02/2017    Screening Tests Health Maintenance  Topic Date Due  . INFLUENZA VACCINE  07/05/2018  . TETANUS/TDAP  01/02/2027  . COLONOSCOPY  01/17/2027  . Hepatitis C Screening  Completed  . PNA vac Low Risk Adult  Completed        Plan:   Follow up with PCP today as scheduled  Continue to eat heart healthy diet (full of fruits, vegetables, whole grains, lean  protein, water--limit salt, fat, and sugar intake) and increase physical activity as tolerated.  Continue doing brain stimulating activities (puzzles, reading, adult coloring books, staying active) to keep memory sharp.   Bring a copy of your living will and/or healthcare power of attorney to your next office visit.    I have personally reviewed and noted the following in the patient's chart:   . Medical and social history . Use of alcohol, tobacco or illicit drugs  . Current medications and supplements . Functional ability and status . Nutritional status . Physical activity . Advanced directives . List of other physicians . Hospitalizations, surgeries, and ER visits in previous 12 months . Vitals . Screenings to include cognitive, depression, and falls . Referrals and appointments  In addition, I have reviewed and discussed with patient certain preventive protocols, quality metrics, and best practice recommendations. A written personalized care plan for preventive services as well as general preventive health recommendations were provided to patient.     Shela Nevin, [place]  03/29/2018   Reviewed and agree with assessment and plan of RN  Mackie Pai, PA-C

## 2018-03-29 ENCOUNTER — Encounter: Payer: Self-pay | Admitting: Medical

## 2018-03-29 ENCOUNTER — Telehealth: Payer: Self-pay | Admitting: Medical

## 2018-03-29 ENCOUNTER — Encounter: Payer: Self-pay | Admitting: *Deleted

## 2018-03-29 ENCOUNTER — Ambulatory Visit (INDEPENDENT_AMBULATORY_CARE_PROVIDER_SITE_OTHER): Payer: Medicare Other | Admitting: Medical

## 2018-03-29 ENCOUNTER — Ambulatory Visit (INDEPENDENT_AMBULATORY_CARE_PROVIDER_SITE_OTHER): Payer: Medicare Other | Admitting: *Deleted

## 2018-03-29 VITALS — BP 140/78 | HR 69 | Ht 73.0 in | Wt 252.6 lb

## 2018-03-29 DIAGNOSIS — Z0000 Encounter for general adult medical examination without abnormal findings: Secondary | ICD-10-CM | POA: Diagnosis not present

## 2018-03-29 DIAGNOSIS — L989 Disorder of the skin and subcutaneous tissue, unspecified: Secondary | ICD-10-CM | POA: Diagnosis not present

## 2018-03-29 DIAGNOSIS — E785 Hyperlipidemia, unspecified: Secondary | ICD-10-CM | POA: Diagnosis not present

## 2018-03-29 DIAGNOSIS — R739 Hyperglycemia, unspecified: Secondary | ICD-10-CM

## 2018-03-29 DIAGNOSIS — I10 Essential (primary) hypertension: Secondary | ICD-10-CM

## 2018-03-29 LAB — COMPREHENSIVE METABOLIC PANEL
ALBUMIN: 4.2 g/dL (ref 3.5–5.2)
ALK PHOS: 38 U/L — AB (ref 39–117)
ALT: 35 U/L (ref 0–53)
AST: 24 U/L (ref 0–37)
BUN: 24 mg/dL — AB (ref 6–23)
CO2: 27 mEq/L (ref 19–32)
Calcium: 9 mg/dL (ref 8.4–10.5)
Chloride: 103 mEq/L (ref 96–112)
Creatinine, Ser: 0.87 mg/dL (ref 0.40–1.50)
GFR: 92.69 mL/min (ref >60.00)
Glucose, Bld: 120 mg/dL — ABNORMAL HIGH (ref 70–99)
POTASSIUM: 4.3 meq/L (ref 3.5–5.1)
Sodium: 136 mEq/L (ref 135–145)
TOTAL PROTEIN: 6.7 g/dL (ref 6.0–8.3)
Total Bilirubin: 0.5 mg/dL (ref 0.2–1.2)

## 2018-03-29 LAB — LIPID PANEL
CHOLESTEROL: 154 mg/dL (ref 0–200)
HDL: 54 mg/dL (ref >39.00)
LDL Cholesterol: 74 mg/dL (ref 0–99)
NonHDL: 100.27
Total CHOL/HDL Ratio: 3
Triglycerides: 130 mg/dL (ref 0.0–149.0)
VLDL: 26 mg/dL (ref 0.0–40.0)

## 2018-03-29 MED ORDER — SIMVASTATIN 40 MG PO TABS: 40.0000 mg | ORAL_TABLET | Freq: Every day | ORAL | 3 refills | Status: DC

## 2018-03-29 MED ORDER — LISINOPRIL-HYDROCHLOROTHIAZIDE 20-25 MG PO TABS: 1.0000 | ORAL_TABLET | Freq: Every day | ORAL | 3 refills | Status: DC

## 2018-03-29 NOTE — Patient Instructions (Signed)
Follow up with PCP today as scheduled  Continue to eat heart healthy diet (full of fruits, vegetables, whole grains, lean protein, water--limit salt, fat, and sugar intake) and increase physical activity as tolerated.  Continue doing brain stimulating activities (puzzles, reading, adult coloring books, staying active) to keep memory sharp.   Bring a copy of your living will and/or healthcare power of attorney to your next office visit.   Health Maintenance, Male A healthy lifestyle and preventive care is important for your health and wellness. Ask your health care provider about what schedule of regular examinations is right for you. What should I know about weight and diet? Eat a Healthy Diet  Eat plenty of vegetables, fruits, whole grains, low-fat dairy products, and lean protein.  Do not eat a lot of foods high in solid fats, added sugars, or salt.  Maintain a Healthy Weight Regular exercise can help you achieve or maintain a healthy weight. You should:  Do at least 150 minutes of exercise each week. The exercise should increase your heart rate and make you sweat (moderate-intensity exercise).  Do strength-training exercises at least twice a week.  Watch Your Levels of Cholesterol and Blood Lipids  Have your blood tested for lipids and cholesterol every 5 years starting at 68 years of age. If you are at high risk for heart disease, you should start having your blood tested when you are 68 years old. You may need to have your cholesterol levels checked more often if: ? Your lipid or cholesterol levels are high. ? You are older than 68 years of age. ? You are at high risk for heart disease.  What should I know about cancer screening? Many types of cancers can be detected early and may often be prevented. Lung Cancer  You should be screened every year for lung cancer if: ? You are a current smoker who has smoked for at least 30 years. ? You are a former smoker who has quit  within the past 15 years.  Talk to your health care provider about your screening options, when you should start screening, and how often you should be screened.  Colorectal Cancer  Routine colorectal cancer screening usually begins at 68 years of age and should be repeated every 5-10 years until you are 68 years old. You may need to be screened more often if early forms of precancerous polyps or small growths are found. Your health care provider may recommend screening at an earlier age if you have risk factors for colon cancer.  Your health care provider may recommend using home test kits to check for hidden blood in the stool.  A small camera at the end of a tube can be used to examine your colon (sigmoidoscopy or colonoscopy). This checks for the earliest forms of colorectal cancer.  Prostate and Testicular Cancer  Depending on your age and overall health, your health care provider may do certain tests to screen for prostate and testicular cancer.  Talk to your health care provider about any symptoms or concerns you have about testicular or prostate cancer.  Skin Cancer  Check your skin from head to toe regularly.  Tell your health care provider about any new moles or changes in moles, especially if: ? There is a change in a mole's size, shape, or color. ? You have a mole that is larger than a pencil eraser.  Always use sunscreen. Apply sunscreen liberally and repeat throughout the day.  Protect yourself by wearing long sleeves,  pants, a wide-brimmed hat, and sunglasses when outside.  What should I know about heart disease, diabetes, and high blood pressure?  If you are 25-67 years of age, have your blood pressure checked every 3-5 years. If you are 40 years of age or older, have your blood pressure checked every year. You should have your blood pressure measured twice-once when you are at a hospital or clinic, and once when you are not at a hospital or clinic. Record the average  of the two measurements. To check your blood pressure when you are not at a hospital or clinic, you can use: ? An automated blood pressure machine at a pharmacy. ? A home blood pressure monitor.  Talk to your health care provider about your target blood pressure.  If you are between 6-80 years old, ask your health care provider if you should take aspirin to prevent heart disease.  Have regular diabetes screenings by checking your fasting blood sugar level. ? If you are at a normal weight and have a low risk for diabetes, have this test once every three years after the age of 55. ? If you are overweight and have a high risk for diabetes, consider being tested at a younger age or more often.  A one-time screening for abdominal aortic aneurysm (AAA) by ultrasound is recommended for men aged 71-75 years who are current or former smokers. What should I know about preventing infection? Hepatitis B If you have a higher risk for hepatitis B, you should be screened for this virus. Talk with your health care provider to find out if you are at risk for hepatitis B infection. Hepatitis C Blood testing is recommended for:  Everyone born from [birth year] through 1966.  Anyone with known risk factors for hepatitis C.  Sexually Transmitted Diseases (STDs)  You should be screened each year for STDs including gonorrhea and chlamydia if: ? You are sexually active and are younger than 68 years of age. ? You are older than 68 years of age and your health care provider tells you that you are at risk for this type of infection. ? Your sexual activity has changed since you were last screened and you are at an increased risk for chlamydia or gonorrhea. Ask your health care provider if you are at risk.  Talk with your health care provider about whether you are at high risk of being infected with HIV. Your health care provider may recommend a prescription medicine to help prevent HIV infection.  What else can I  do?  Schedule regular health, dental, and eye exams.  Stay current with your vaccines (immunizations).  Do not use any tobacco products, such as cigarettes, chewing tobacco, and e-cigarettes. If you need help quitting, ask your health care provider.  Limit alcohol intake to no more than 2 drinks per day. One drink equals 12 ounces of beer, 5 ounces of wine, or 1 ounces of hard liquor.  Do not use street drugs.  Do not share needles.  Ask your health care provider for help if you need support or information about quitting drugs.  Tell your health care provider if you often feel depressed.  Tell your health care provider if you have ever been abused or do not feel safe at home. This information is not intended to replace advice given to you by your health care provider. Make sure you discuss any questions you have with your health care provider. Document Released: 05/19/2008 Document Revised: 07/20/2016 Document Reviewed: 08/25/2015 Elsevier  Interactive Patient Education  [employer].

## 2018-03-29 NOTE — Progress Notes (Signed)
Subjective:    Patient ID: Gregory Tate, male    DOB: 1951/07/20, 67 y.o.   MRN: [id number]  HPI  Pt in for follow up.  Pt bp is moderate well controlled today. When he checks or office checked 353-614 systolic. He can remember diastolic.  Pt has history of shingles. No severe neuropathy. Faint itch to area only on and off. Pt will get vaccine for the shingles when it is in stock.  Pt has hx of elevated lipids/ triglycerides. On simvastatin. Pt had no side effects.   Pt is fasting today.  Pt is walking some daily.   Pt has hx of AC separation. He is doing some exercises.    Review of Systems  Constitutional: Negative for chills, fatigue and fever.  HENT: Negative for congestion, drooling, ear pain, facial swelling, sinus pressure and sneezing.   Respiratory: Negative for cough, chest tightness, shortness of breath and wheezing.   Cardiovascular: Negative for chest pain and palpitations.  Gastrointestinal: Negative for abdominal distention, abdominal pain, diarrhea, nausea and vomiting.  Genitourinary: Negative for decreased urine volume, difficulty urinating, enuresis, flank pain, frequency, hematuria, penile swelling and scrotal swelling.  Musculoskeletal: Negative for back pain, gait problem, myalgias and neck stiffness.  Skin: Negative for rash.  Neurological: Negative for dizziness, weakness, light-headedness and headaches.  Hematological: Negative for adenopathy. Does not bruise/bleed easily.  Psychiatric/Behavioral: Negative for behavioral problems and confusion.     Past Medical History:  Diagnosis Date  . Chicken pox   . Dermatitis 01/28/2013  . Fatty liver    mild  . 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 Hyperlipidemia   . Hypertension   . Mumps   . Shingles 08/05/2017     Social History   Socioeconomic History  . Marital status:  Married    Spouse name: Not on file  . Number of children: Not on file  . Years of education: Not on file  . Highest education level: Not on file  Occupational History  . Not on file  Social Needs  . Financial resource strain: Not on file  . Food insecurity:    Worry: Not on file    Inability: Not on file  . Transportation needs:    Medical: Not on file    Non-medical: Not on file  Tobacco Use  . Smoking status: Never Smoker  . Smokeless tobacco: Never Used  Substance and Sexual Activity  . Alcohol use: Yes    Alcohol/week: 1.8 oz    Types: 3 Cans of beer per week  . Drug use: No  . Sexual activity: Not Currently  Lifestyle  . Physical activity:    Days per week: Not on file    Minutes per session: Not on file  . Stress: Not on file  Relationships  . Social connections:    Talks on phone: Not on file    Gets together: Not on file    Attends religious service: Not on file    Active member of club or organization: Not on file    Attends meetings of clubs or organizations: Not on file    Relationship status: Not on file  . Intimate partner violence:    Fear of current or ex partner: Not on file    Emotionally abused: Not on file    Physically abused: Not on file  Forced sexual activity: Not on file  Other Topics Concern  . 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      [place]   Married for 63 year    one son 14    New grandchild   Alcohol use-yes (social)  < 14 drinks per week.    Past Surgical History:  Procedure Laterality Date  . COLONOSCOPY  2007  . TONSILLECTOMY    . URINARY DIVERSION  2002   radical cystoprostatecomy and urinary diversion     Family History  Problem Relation Age of Onset  . Coronary artery disease Father 30       Deceased  . Hypertension Father   . Alzheimer's disease Father   . Hypertension Mother        Living  . Diabetes Brother         borderline #1  . Stroke Paternal Grandfather   . Heart attack Maternal Grandfather   . Arthritis Other   . Hypertension Brother        #2  . Heart disease Brother        #2  . Healthy Son        x1    No Known Allergies  Current Outpatient Medications on File Prior to Visit  Medication Sig Dispense Refill  . aspirin 81 MG tablet Take 81 mg by mouth daily.      . Cholecalciferol (VITAMIN D3) 1000 UNITS tablet Take 1,000 Units by mouth daily.      . Coenzyme Q10 (CO Q 10) 100 MG CAPS Take 100 mg by mouth daily.      Marland Kitchen HYDROcodone-acetaminophen (NORCO) 10-325 MG tablet Take 1 tablet by mouth every 6 (six) hours as needed.    Marland Kitchen lisinopril-hydrochlorothiazide (PRINZIDE,ZESTORETIC) 20-25 MG tablet Take 1 tablet by mouth daily. 90 tablet 1  . NON FORMULARY Cherry Extract as needed for Gout    . simvastatin (ZOCOR) 40 MG tablet Take 1 tablet (40 mg total) by mouth at bedtime. 90 tablet 1  . traMADol (ULTRAM) 50 MG tablet Take 1 tablet (50 mg total) by mouth every 6 (six) hours as needed. (Patient not taking: Reported on 03/29/2018) 16 tablet 0  . zoster vaccine live, PF, (ZOSTAVAX) 01751 UNT/0.65ML injection Inject 19,400 Units into the skin once. (Patient not taking: Reported on 05/27/2016) 1 each 0   No current facility-administered medications on file prior to visit.     BP 140/78   Pulse 69   Ht 6\' 1"  (1.854 m)   Wt 252 lb 9.6 oz (114.6 kg)   SpO2 98%   BMI 33.33 kg/m       Objective:   Physical Exam  General Mental Status- Alert. General Appearance- Not in acute distress.   Skin General: Color- Normal Color. Moisture- Normal Moisture.  Neck Carotid Arteries- Normal color. Moisture- Normal Moisture. No carotid bruits. No JVD.  Chest and Lung Exam Auscultation: Breath Sounds:-Normal.  Cardiovascular Auscultation:Rythm- Regular. Murmurs & Other Heart Sounds:Auscultation of the heart reveals- No Murmurs.  Abdomen Inspection:-Inspeection  Normal. Palpation/Percussion:Note:No mass. Palpation and Percussion of the abdomen reveal- Non Tender, Non Distended + BS, no rebound or guarding.  Neurologic Cranial Nerve exam:- CN III-XII intact(No nystagmus), symmetric smile. Strength:- 5/5 equal and symmetric strength both upper and lower extremities.  Skin- scattered moles, large s keratosis. Some solar lentigo.     Assessment & Plan:  Your  bp is well controlled today. Refilling your bp med today.  For high cholesterol refilled zocor today.  Please get cmp and lipid panel today.  Please self refer to dermatologist. But if no appointment within a month please let me know and I could place referral and our staff can call.  Follow up date to be determined after lab review.  Mackie Pai, PA-C

## 2018-03-29 NOTE — Patient Instructions (Signed)
Your bp is well controlled today. Refilling your bp med today.  For high cholesterol refilled zocor today.  Please get cmp and lipid panel today.  Please self refer to dermatologist. But if no appointment within a month please let me know and I could place referral and our staff can call.  Follow up date to be determined after lab review.

## 2018-03-29 NOTE — Telephone Encounter (Signed)
Future A1c placed. 

## 2018-04-11 DIAGNOSIS — L821 Other seborrheic keratosis: Secondary | ICD-10-CM | POA: Diagnosis not present

## 2018-04-11 DIAGNOSIS — L814 Other melanin hyperpigmentation: Secondary | ICD-10-CM | POA: Diagnosis not present

## 2018-04-11 DIAGNOSIS — M25559 Pain in unspecified hip: Secondary | ICD-10-CM | POA: Diagnosis not present

## 2018-04-11 DIAGNOSIS — L570 Actinic keratosis: Secondary | ICD-10-CM | POA: Diagnosis not present

## 2018-04-11 DIAGNOSIS — M25551 Pain in right hip: Secondary | ICD-10-CM | POA: Diagnosis not present

## 2018-04-11 DIAGNOSIS — D1800 Hemangioma unspecified site: Secondary | ICD-10-CM | POA: Diagnosis not present

## 2018-04-13 DIAGNOSIS — M25511 Pain in right shoulder: Secondary | ICD-10-CM | POA: Diagnosis not present

## 2018-04-13 DIAGNOSIS — S43109A Unspecified dislocation of unspecified acromioclavicular joint, initial encounter: Secondary | ICD-10-CM | POA: Diagnosis not present

## 2018-04-19 DIAGNOSIS — M25551 Pain in right hip: Secondary | ICD-10-CM | POA: Diagnosis not present

## 2018-04-25 DIAGNOSIS — M25551 Pain in right hip: Secondary | ICD-10-CM | POA: Diagnosis not present

## 2018-05-03 DIAGNOSIS — M25551 Pain in right hip: Secondary | ICD-10-CM | POA: Diagnosis not present

## 2018-05-09 DIAGNOSIS — M25551 Pain in right hip: Secondary | ICD-10-CM | POA: Diagnosis not present

## 2018-05-16 DIAGNOSIS — M25551 Pain in right hip: Secondary | ICD-10-CM | POA: Diagnosis not present

## 2018-05-28 DIAGNOSIS — G5701 Lesion of sciatic nerve, right lower limb: Secondary | ICD-10-CM | POA: Diagnosis not present

## 2018-05-28 DIAGNOSIS — M25551 Pain in right hip: Secondary | ICD-10-CM | POA: Diagnosis not present

## 2018-05-30 DIAGNOSIS — M25551 Pain in right hip: Secondary | ICD-10-CM | POA: Diagnosis not present

## 2018-06-11 DIAGNOSIS — M25551 Pain in right hip: Secondary | ICD-10-CM | POA: Diagnosis not present

## 2018-06-18 DIAGNOSIS — B078 Other viral warts: Secondary | ICD-10-CM | POA: Diagnosis not present

## 2018-06-18 DIAGNOSIS — L570 Actinic keratosis: Secondary | ICD-10-CM | POA: Diagnosis not present

## 2018-06-18 DIAGNOSIS — D224 Melanocytic nevi of scalp and neck: Secondary | ICD-10-CM | POA: Diagnosis not present

## 2018-06-25 DIAGNOSIS — M25551 Pain in right hip: Secondary | ICD-10-CM | POA: Diagnosis not present

## 2018-07-09 DIAGNOSIS — M25551 Pain in right hip: Secondary | ICD-10-CM | POA: Diagnosis not present

## 2018-08-14 DIAGNOSIS — M25551 Pain in right hip: Secondary | ICD-10-CM | POA: Diagnosis not present

## 2018-08-30 DIAGNOSIS — H01009 Unspecified blepharitis unspecified eye, unspecified eyelid: Secondary | ICD-10-CM | POA: Diagnosis not present

## 2018-08-30 DIAGNOSIS — H04123 Dry eye syndrome of bilateral lacrimal glands: Secondary | ICD-10-CM | POA: Diagnosis not present

## 2018-09-03 DIAGNOSIS — M25551 Pain in right hip: Secondary | ICD-10-CM | POA: Diagnosis not present

## 2018-09-17 DIAGNOSIS — M25551 Pain in right hip: Secondary | ICD-10-CM | POA: Diagnosis not present

## 2018-10-24 DIAGNOSIS — M25551 Pain in right hip: Secondary | ICD-10-CM | POA: Diagnosis not present

## 2019-01-17 ENCOUNTER — Telehealth: Payer: Self-pay | Admitting: Medical

## 2019-01-17 ENCOUNTER — Ambulatory Visit (INDEPENDENT_AMBULATORY_CARE_PROVIDER_SITE_OTHER): Payer: Medicare Other | Admitting: Medical

## 2019-01-17 ENCOUNTER — Encounter: Payer: Self-pay | Admitting: Medical

## 2019-01-17 VITALS — BP 151/81 | HR 70 | Temp 98.2°F | Resp 16 | Ht 73.0 in | Wt 250.6 lb

## 2019-01-17 DIAGNOSIS — I10 Essential (primary) hypertension: Secondary | ICD-10-CM | POA: Diagnosis not present

## 2019-01-17 DIAGNOSIS — E785 Hyperlipidemia, unspecified: Secondary | ICD-10-CM

## 2019-01-17 DIAGNOSIS — M545 Low back pain, unspecified: Secondary | ICD-10-CM

## 2019-01-17 DIAGNOSIS — R739 Hyperglycemia, unspecified: Secondary | ICD-10-CM | POA: Diagnosis not present

## 2019-01-17 LAB — COMPREHENSIVE METABOLIC PANEL
ALT: 31 U/L (ref 0–53)
AST: 21 U/L (ref 0–37)
Albumin: 4.6 g/dL (ref 3.5–5.2)
Alkaline Phosphatase: 41 U/L (ref 39–117)
BILIRUBIN TOTAL: 0.7 mg/dL (ref 0.2–1.2)
BUN: 19 mg/dL (ref 6–23)
CALCIUM: 9.2 mg/dL (ref 8.4–10.5)
CO2: 33 mEq/L — ABNORMAL HIGH (ref 19–32)
CREATININE: 0.95 mg/dL (ref 0.40–1.50)
Chloride: 100 mEq/L (ref 96–112)
GFR: 78.6 mL/min (ref >60.00)
Glucose, Bld: 103 mg/dL — ABNORMAL HIGH (ref 70–99)
Potassium: 4.8 mEq/L (ref 3.5–5.1)
Sodium: 139 mEq/L (ref 135–145)
Total Protein: 6.7 g/dL (ref 6.0–8.3)

## 2019-01-17 LAB — LIPID PANEL
CHOLESTEROL: 170 mg/dL (ref 0–200)
HDL: 57.1 mg/dL (ref >39.00)
LDL Cholesterol: 80 mg/dL (ref 0–99)
NonHDL: 112.78
Total CHOL/HDL Ratio: 3
Triglycerides: 163 mg/dL — ABNORMAL HIGH (ref 0.0–149.0)
VLDL: 32.6 mg/dL (ref 0.0–40.0)

## 2019-01-17 LAB — HEMOGLOBIN A1C: HEMOGLOBIN A1C: 6.6 % — AB (ref 4.6–6.5)

## 2019-01-17 MED ORDER — CYCLOBENZAPRINE HCL 5 MG PO TABS: 5.0000 mg | ORAL_TABLET | Freq: Every day | ORAL | 1 refills | Status: DC

## 2019-01-17 MED ORDER — MUPIROCIN 2 % EX OINT
TOPICAL_OINTMENT | CUTANEOUS | 0 refills | Status: DC
Start: 2019-01-17 — End: 2023-05-30

## 2019-01-17 NOTE — Patient Instructions (Signed)
For your recent left side back/pain in region above iliac crest, I want you to take Flexeril 5 mg tablets at night, low-dose ibuprofen as we discussed and apply lidocaine gel to that area.  You can do back stretching exercises.  If your pain is persisting by next Tuesday or Wednesday let me know and I would refer you to sports medicine.  For your intermittent long history of small bumps in left upper thorax area, I am prescribing mupirocin topical ointment to apply twice a day for 10 days.  This area might represent also folliculitis.  This is chronic and intermittent so I do not think it is shingles.  If you notice any severe pain in that region or a small vesicles/blisters let me know and would start antiviral medication.  For your hypertension, elevated blood sugar history and hyperlipidemia, will get CMP, A1c and lipid panel today.  Follow-up date to be determined after lab review.

## 2019-01-17 NOTE — Telephone Encounter (Signed)
I put order to get urine poct. Will you check and see if he gave in lab. I forgot to have him collect on our side.

## 2019-01-17 NOTE — Progress Notes (Signed)
Subjective:    Patient ID: Gregory Tate, male    DOB: [birth year], 68 y.o.   MRN: [id number]  HPI  Pt states 10 days ago he got out of bed and noticed moderate back pain/points so area above iliac spine(also notes left leg muscles feel tight). He states pain was worse and had gradually gotten better. The pain is worse in the morning when first getting. He states he has to pull himself up with arms. He states riding in car and hitting bumps will feel pain.   No pain radiating to legs. No weakness.   Pt has tried ibuprofen. Has helped some but pain is lingering. Pt is using biofreeze and cbd cream that seems to help.  Pt has htn, elevated sugar and high triglycerides. He is fasting today so will get labs.     Review of Systems  Constitutional: Negative for chills, fatigue and fever.  Respiratory: Negative for cough, chest tightness, shortness of breath and wheezing.   Cardiovascular: Negative for chest pain and palpitations.  Gastrointestinal: Positive for abdominal pain. Negative for constipation, nausea and vomiting.       Some recent reflux symptoms after drinking cranberry.  Musculoskeletal: Positive for back pain. Negative for joint swelling, neck pain and neck stiffness.       See hpi.  Skin: Negative for rash.  Neurological: Negative for dizziness, seizures, weakness and headaches.  Hematological: Negative for adenopathy. Does not bruise/bleed easily.  Psychiatric/Behavioral: Negative for behavioral problems and confusion. The patient is not nervous/anxious.     Past Medical History:  Diagnosis Date  . Chicken pox   . Dermatitis 01/28/2013  . Fatty liver    mild  . 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 Hyperlipidemia   . Hypertension   . Mumps   . Shingles 08/05/2017     Social History   Socioeconomic History  . Marital status: Married    Spouse  name: Not on file  . Number of children: Not on file  . Years of education: Not on file  . Highest education level: Not on file  Occupational History  . Not on file  Social Needs  . Financial resource strain: Not on file  . Food insecurity:    Worry: Not on file    Inability: Not on file  . Transportation needs:    Medical: Not on file    Non-medical: Not on file  Tobacco Use  . Smoking status: Never Smoker  . Smokeless tobacco: Never Used  Substance and Sexual Activity  . Alcohol use: Yes    Alcohol/week: 3.0 standard drinks    Types: 3 Cans of beer per week  . Drug use: No  . Sexual activity: Not Currently  Lifestyle  . Physical activity:    Days per week: Not on file    Minutes per session: Not on file  . Stress: Not on file  Relationships  . Social connections:    Talks on phone: Not on file    Gets together: Not on file    Attends religious service: Not on file    Active member of club or organization: Not on file    Attends meetings of clubs or organizations: Not on file    Relationship status: Not on file  . Intimate partner violence:    Fear of current or ex partner: Not on  file    Emotionally abused: Not on file    Physically abused: Not on file    Forced sexual activity: Not on file  Other Topics Concern  . 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      [place]   Married for 54 year    one son 57    New grandchild   Alcohol use-yes (social)  < 14 drinks per week.    Past Surgical History:  Procedure Laterality Date  . COLONOSCOPY  2007  . TONSILLECTOMY    . URINARY DIVERSION  2002   radical cystoprostatecomy and urinary diversion     Family History  Problem Relation Age of Onset  . Coronary artery disease Father 28       Deceased  . Hypertension Father   . Alzheimer's disease Father   . Hypertension Mother        Living  . Diabetes Brother         borderline #1  . Stroke Paternal Grandfather   . Heart attack Maternal Grandfather   . Arthritis Other   . Hypertension Brother        #2  . Heart disease Brother        #2  . Healthy Son        x1    No Known Allergies  Current Outpatient Medications on File Prior to Visit  Medication Sig Dispense Refill  . aspirin 81 MG tablet Take 81 mg by mouth daily.      . Cholecalciferol (VITAMIN D3) 1000 UNITS tablet Take 1,000 Units by mouth daily.      Marland Kitchen lisinopril-hydrochlorothiazide (PRINZIDE,ZESTORETIC) 20-25 MG tablet Take 1 tablet by mouth daily. 90 tablet 3  . NON FORMULARY Cherry Extract as needed for Gout    . simvastatin (ZOCOR) 40 MG tablet Take 1 tablet (40 mg total) by mouth at bedtime. 90 tablet 3   No current facility-administered medications on file prior to visit.     BP (!) 151/81   Pulse 70   Temp 98.2 F (36.8 C) (Oral)   Resp 16   Ht 6\' 1"  (1.854 m)   Wt 250 lb 9.6 oz (113.7 kg)   SpO2 98%   BMI 33.06 kg/m       Objective:   Physical Exam  General Appearance- Not in acute distress.    Chest and Lung Exam Auscultation: Breath sounds:-Normal. Clear even and unlabored. Adventitious sounds:- No Adventitious sounds.  Cardiovascular Auscultation:Rythm - Regular, rate and rythm. Heart Sounds -Normal heart sounds.  Abdomen Inspection:-Inspection Normal.  Palpation/Perucssion: Palpation and Percussion of the abdomen reveal- Non Tender, No Rebound tenderness, No rigidity(Guarding) and No Palpable abdominal masses.  Liver:-Normal.  Spleen:- Normal.   Back No lumbar spine tenderness to palpation. No pain on straight leg lift. No pain on lateral movements and flexion/extension of the spine.  Lower ext neurologic  L5-S1 sensation intact bilaterally. Normal patellar reflexes bilaterally. No foot drop bilaterally.  Left upper side back- small papules/scabs linear pattern. One tender. No vesicles.     Assessment & Plan:  For your recent left side  back/pain in region above iliac crest, I want you to take Flexeril 5 mg tablets at night, low-dose ibuprofen as we discussed and apply lidocaine gel to that area.  You can do back stretching exercises.  If your pain is persisting by next Tuesday or Wednesday let  me know and I would refer you to sports medicine.  For your intermittent long history of small bumps in left upper thorax area, I am prescribing mupirocin topical ointment to apply twice a day for 10 days.  This area might represent also folliculitis.  This is chronic and intermittent so I do not think it is shingles.  If you notice any severe pain in that region or a small vesicles/blisters let me know and would start antiviral medication.  For your hypertension, elevated blood sugar history and hyperlipidemia, will get CMP, A1c and lipid panel today.  Follow-up date to be determined after lab review.  Mackie Pai, PA-C

## 2019-03-23 ENCOUNTER — Other Ambulatory Visit: Payer: Self-pay | Admitting: Medical

## 2019-04-01 NOTE — Progress Notes (Addendum)
Virtual Visit via Video Note  I connected with patient on 04/02/19 at  9:00 AM EDT by a video enabled telemedicine application and verified that I am speaking with the correct person using two identifiers.   THIS ENCOUNTER IS A VIRTUAL VISIT DUE TO COVID-19 - PATIENT WAS NOT SEEN IN THE OFFICE. PATIENT HAS CONSENTED TO VIRTUAL VISIT / TELEMEDICINE VISIT   Location of patient: home  Location of provider: office  I discussed the limitations of evaluation and management by telemedicine and the availability of in person appointments. The patient expressed understanding and agreed to proceed.   Subjective:   Gregory Tate is a 68 y.o. male who presents for Medicare Annual/Subsequent preventive examination. Prior to pandemic, still working part time as Forensic scientist.   Review of Systems: No ROS.  Medicare Wellness Visit. Additional risk factors are reflected in the social history. Cardiac Risk Factors include: advanced age (>54men, >67 women);dyslipidemia;hypertension;male gender Sleep patterns: Sleeps well Home Safety/Smoke Alarms: Feels safe in home. Smoke alarms in place.  Lives in 1 story home with wife and dogs.  Male:   CCS-01/17/17.  10 yr recall per pt.    PSA-  Lab Results  Component Value Date   PSA 0.00 (L) 02/24/2015   PSA <0.01 ng/mL 01/20/2011       Objective:    Vitals: Unable to assess. This visit is enabled though telemedicine due to Covid 19. Pt reports BP=131/83. HR=64. T=98.4. Wt= 246 lb.  Advanced Directives 04/02/2019 03/29/2018  Does Patient Have a Medical Advance Directive? Yes Yes  Type of Paramedic of [place];Living will [place];Living will  Does patient want to make changes to medical advance directive? No - Patient declined No - Patient declined  Copy of [place] in Chart? No - copy requested No - copy requested    Tobacco Social History   Tobacco Use  Smoking Status Never Smoker   Smokeless Tobacco Never Used     Counseling given: Not Answered   Clinical Intake: Pain : No/denies pain    Past Medical History:  Diagnosis Date  . Chicken pox   . Dermatitis 01/28/2013  . Fatty liver    mild  . 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 Hyperlipidemia   . Hypertension   . Mumps   . Shingles 08/05/2017   Past Surgical History:  Procedure Laterality Date  . COLONOSCOPY  2007  . TONSILLECTOMY    . URINARY DIVERSION  2002   radical cystoprostatecomy and urinary diversion    Family History  Problem Relation Age of Onset  . Coronary artery disease Father 33       Deceased  . Hypertension Father   . Alzheimer's disease Father   . Hypertension Mother        Living  . Diabetes Brother        borderline #1  . Stroke Paternal Grandfather   . Heart attack Maternal Grandfather   . Arthritis Other   . Hypertension Brother        #2  . Heart disease Brother        #2  . Healthy Son        x1   Social History   Socioeconomic History  . Marital status: Married    Spouse name: Not on file  . Number of children:  Not on file  . Years of education: Not on file  . Highest education level: Not on file  Occupational History  . Not on file  Social Needs  . Financial resource strain: Not on file  . Food insecurity:    Worry: Not on file    Inability: Not on file  . Transportation needs:    Medical: Not on file    Non-medical: Not on file  Tobacco Use  . Smoking status: Never Smoker  . Smokeless tobacco: Never Used  Substance and Sexual Activity  . Alcohol use: Yes    Alcohol/week: 3.0 standard drinks    Types: 3 Cans of beer per week  . Drug use: No  . Sexual activity: Not Currently  Lifestyle  . Physical activity:    Days per week: Not on file    Minutes per session: Not on file  . Stress: Not on file  Relationships  . Social  connections:    Talks on phone: Not on file    Gets together: Not on file    Attends religious service: Not on file    Active member of club or organization: Not on file    Attends meetings of clubs or organizations: Not on file    Relationship status: Not on file  Other Topics Concern  . 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      [place]   Married for 58 year    one son 4    New grandchild   Alcohol use-yes (social)  < 14 drinks per week.    Outpatient Encounter Medications as of 04/02/2019  Medication Sig  . aspirin 81 MG tablet Take 81 mg by mouth daily.    . Cholecalciferol (VITAMIN D3) 1000 UNITS tablet Take 1,000 Units by mouth daily.    Marland Kitchen lisinopril-hydrochlorothiazide (ZESTORETIC) 20-25 MG tablet TAKE 1 TABLET EVERY DAY  . mupirocin ointment (BACTROBAN) 2 % Apply thin film twice daily  . NON FORMULARY Cherry Extract as needed for Gout  . simvastatin (ZOCOR) 40 MG tablet TAKE 1 TABLET AT BEDTIME.  . cyclobenzaprine (FLEXERIL) 5 MG tablet Take 1 tablet (5 mg total) by mouth at bedtime. (Patient not taking: Reported on 04/02/2019)   No facility-administered encounter medications on file as of 04/02/2019.     Activities of Daily Living In your present state of health, do you have any difficulty performing the following activities: 04/02/2019  Hearing? N  Vision? N  Difficulty concentrating or making decisions? N  Walking or climbing stairs? N  Dressing or bathing? N  Doing errands, shopping? N  Preparing Food and eating ? N  Using the Toilet? N  In the past six months, have you accidently leaked urine? N  Do you have problems with loss of bowel control? N  Managing your Medications? N  Managing your Finances? N  Housekeeping or managing your Housekeeping? N  Some recent data might be hidden    Patient Care Team: Saguier, Iris Pert as PCP - General (Internal Medicine)  Franchot Gallo, MD as Consulting Physician (Urology) Selina Cooley, MD as Referring Physician (Dermatology)   Assessment:   This is a routine wellness examination for Gregory Tate. Physical assessment deferred to PCP. Exercise Activities and Dietary recommendations Current Exercise Habits: Home exercise routine, Type of exercise: walking, Time (Minutes): 40, Frequency (Times/Week): 7, Weekly Exercise (Minutes/Week): 280, Intensity: Mild, Exercise limited by: None  identified   Diet (meal preparation, eat out, water intake, caffeinated beverages, dairy products, fruits and vegetables): in general, a "healthy" diet  , well balanced, on average, 3 meals per day  Goals    . DIET - INCREASE WATER INTAKE       Fall Risk Fall Risk  04/02/2019 03/29/2018 07/03/2017 01/15/2016 02/24/2015  Falls in the past year? 0 No No No No  Comment - - Emmi Telephone Survey: data to providers prior to load - -   Depression Screen PHQ 2/9 Scores 04/02/2019 03/29/2018 01/15/2016  PHQ - 2 Score 0 0 0    Cognitive Function: Ad8 score reviewed for issues:  Issues making decisions:no  Less interest in hobbies / activities:no  Repeats questions, stories (family complaining):no  Trouble using ordinary gadgets (microwave, computer, phone):no  Forgets the month or year: no  Mismanaging finances: no  Remembering appts:no  Daily problems with thinking and/or memory:no Ad8 score is=0         Immunization History  Administered Date(s) Administered  . Influenza Whole 11/30/2007, 08/28/2009  . Influenza-Unspecified 04/05/2015  . Pneumococcal Conjugate-13 02/24/2015  . Pneumococcal Polysaccharide-23 01/02/2017  . Td 05/22/2006  . Tdap 01/02/2017   Screening Tests Health Maintenance  Topic Date Due  . INFLUENZA VACCINE  07/06/2019  . TETANUS/TDAP  01/02/2027  . COLONOSCOPY  01/17/2027  . Hepatitis C Screening  Completed  . PNA vac Low Risk Adult  Completed       Plan:   See you next year!   Continue to eat heart healthy diet (full of fruits, vegetables, whole grains, lean protein, water--limit salt, fat, and sugar intake) and increase physical activity as tolerated.  Continue doing brain stimulating activities (puzzles, reading, adult coloring books, staying active) to keep memory sharp.   Bring a copy of your living will and/or healthcare power of attorney to your next office visit.    I have personally reviewed and noted the following in the patient's chart:   . Medical and social history . Use of alcohol, tobacco or illicit drugs  . Current medications and supplements . Functional ability and status . Nutritional status . Physical activity . Advanced directives . List of other physicians . Hospitalizations, surgeries, and ER visits in previous 12 months . Vitals . Screenings to include cognitive, depression, and falls . Referrals and appointments  In addition, I have reviewed and discussed with patient certain preventive protocols, quality metrics, and best practice recommendations. A written personalized care plan for preventive services as well as general preventive health recommendations were provided to patient.     Shela Nevin, [place]  04/02/2019   Reviewed and agree with Gasper Lloyd & plan or RN.  Mackie Pai, PA-C

## 2019-04-02 ENCOUNTER — Telehealth: Payer: Self-pay | Admitting: Medical

## 2019-04-02 ENCOUNTER — Other Ambulatory Visit: Payer: Self-pay

## 2019-04-02 ENCOUNTER — Encounter: Payer: Self-pay | Admitting: *Deleted

## 2019-04-02 ENCOUNTER — Ambulatory Visit (INDEPENDENT_AMBULATORY_CARE_PROVIDER_SITE_OTHER): Payer: Medicare Other | Admitting: *Deleted

## 2019-04-02 DIAGNOSIS — Z0000 Encounter for general adult medical examination without abnormal findings: Secondary | ICD-10-CM | POA: Diagnosis not present

## 2019-04-02 NOTE — Telephone Encounter (Signed)
Opened to review 

## 2019-04-02 NOTE — Patient Instructions (Signed)
See you next year!  Continue to eat heart healthy diet (full of fruits, vegetables, whole grains, lean protein, water--limit salt, fat, and sugar intake) and increase physical activity as tolerated.  Continue doing brain stimulating activities (puzzles, reading, adult coloring books, staying active) to keep memory sharp.   Bring a copy of your living will and/or healthcare power of attorney to your next office visit.   Mr. Gregory Tate , Thank you for taking time to come for your Medicare Wellness Visit. I appreciate your ongoing commitment to your health goals. Please review the following plan we discussed and let me know if I can assist you in the future.   These are the goals we discussed: Goals    . DIET - INCREASE WATER INTAKE       This is a list of the screening recommended for you and due dates:  Health Maintenance  Topic Date Due  . Flu Shot  07/06/2019  . Tetanus Vaccine  01/02/2027  . Colon Cancer Screening  01/17/2027  .  Hepatitis C: One time screening is recommended by Center for Disease Control  (CDC) for  adults born from [birth year] through 1966.   Completed  . Pneumonia vaccines  Completed    Health Maintenance After Age 48 After age 85, you are at a higher risk for certain long-term diseases and infections as well as injuries from falls. Falls are a major cause of broken bones and head injuries in people who are older than age 18. Getting regular preventive care can help to keep you healthy and well. Preventive care includes getting regular testing and making lifestyle changes as recommended by your health care provider. Talk with your health care provider about:  Which screenings and tests you should have. A screening is a test that checks for a disease when you have no symptoms.  A diet and exercise plan that is right for you. What should I know about screenings and tests to prevent falls? Screening and testing are the best ways to find a health problem early. Early  diagnosis and treatment give you the best chance of managing medical conditions that are common after age 21. Certain conditions and lifestyle choices may make you more likely to have a fall. Your health care provider may recommend:  Regular vision checks. Poor vision and conditions such as cataracts can make you more likely to have a fall. If you wear glasses, make sure to get your prescription updated if your vision changes.  Medicine review. Work with your health care provider to regularly review all of the medicines you are taking, including over-the-counter medicines. Ask your health care provider about any side effects that may make you more likely to have a fall. Tell your health care provider if any medicines that you take make you feel dizzy or sleepy.  Osteoporosis screening. Osteoporosis is a condition that causes the bones to get weaker. This can make the bones weak and cause them to break more easily.  Blood pressure screening. Blood pressure changes and medicines to control blood pressure can make you feel dizzy.  Strength and balance checks. Your health care provider may recommend certain tests to check your strength and balance while standing, walking, or changing positions.  Foot health exam. Foot pain and numbness, as well as not wearing proper footwear, can make you more likely to have a fall.  Depression screening. You may be more likely to have a fall if you have a fear of falling, feel emotionally low,  or feel unable to do activities that you used to do.  Alcohol use screening. Using too much alcohol can affect your balance and may make you more likely to have a fall. What actions can I take to lower my risk of falls? General instructions  Talk with your health care provider about your risks for falling. Tell your health care provider if: ? You fall. Be sure to tell your health care provider about all falls, even ones that seem minor. ? You feel dizzy, sleepy, or  off-balance.  Take over-the-counter and prescription medicines only as told by your health care provider. These include any supplements.  Eat a healthy diet and maintain a healthy weight. A healthy diet includes low-fat dairy products, low-fat (lean) meats, and fiber from whole grains, beans, and lots of fruits and vegetables. Home safety  Remove any tripping hazards, such as rugs, cords, and clutter.  Install safety equipment such as grab bars in bathrooms and safety rails on stairs.  Keep rooms and walkways well-lit. Activity   Follow a regular exercise program to stay fit. This will help you maintain your balance. Ask your health care provider what types of exercise are appropriate for you.  If you need a cane or walker, use it as recommended by your health care provider.  Wear supportive shoes that have nonskid soles. Lifestyle  Do not drink alcohol if your health care provider tells you not to drink.  If you drink alcohol, limit how much you have: ? 0-1 drink a day for women. ? 0-2 drinks a day for men.  Be aware of how much alcohol is in your drink. In the [place] one drink equals one typical bottle of beer (12 oz), one-half glass of wine (5 oz), or one shot of hard liquor (1 oz).  Do not use any products that contain nicotine or tobacco, such as cigarettes and e-cigarettes. If you need help quitting, ask your health care provider. Summary  Having a healthy lifestyle and getting preventive care can help to protect your health and wellness after age 4.  Screening and testing are the best way to find a health problem early and help you avoid having a fall. Early diagnosis and treatment give you the best chance for managing medical conditions that are more common for people who are older than age 47.  Falls are a major cause of broken bones and head injuries in people who are older than age 42. Take precautions to prevent a fall at home.  Work with your health care provider  to learn what changes you can make to improve your health and wellness and to prevent falls. This information is not intended to replace advice given to you by your health care provider. Make sure you discuss any questions you have with your health care provider. Document Released: 10/04/2017 Document Revised: 10/04/2017 Document Reviewed: 10/04/2017 Elsevier Interactive Patient Education  2019 [employer].

## 2019-04-02 NOTE — Telephone Encounter (Signed)
Reviewed RN wellness exam today. He is due for visit week of Apr 22, 2019. Can you get him scheduled. Important that he check his bp and pulse on day of virtual visit. At home if he has bp cuff or at pharmacy.

## 2019-04-03 ENCOUNTER — Ambulatory Visit (INDEPENDENT_AMBULATORY_CARE_PROVIDER_SITE_OTHER): Payer: Medicare Other | Admitting: Medical

## 2019-04-03 ENCOUNTER — Other Ambulatory Visit: Payer: Self-pay

## 2019-04-03 ENCOUNTER — Encounter: Payer: Self-pay | Admitting: Medical

## 2019-04-03 VITALS — BP 131/83 | HR 64 | Temp 98.4°F | Wt 246.0 lb

## 2019-04-03 DIAGNOSIS — R739 Hyperglycemia, unspecified: Secondary | ICD-10-CM

## 2019-04-03 DIAGNOSIS — E785 Hyperlipidemia, unspecified: Secondary | ICD-10-CM

## 2019-04-03 DIAGNOSIS — I10 Essential (primary) hypertension: Secondary | ICD-10-CM | POA: Diagnosis not present

## 2019-04-03 NOTE — Telephone Encounter (Signed)
Pt scheduled for today.  

## 2019-04-03 NOTE — Patient Instructions (Addendum)
Bp well controlled continue current meds.  Mild elevated sugar in past but recent more exercise and better diet. Should help keep sugar down.  For high triglycerides advise low fat diet. Continue zocor. Will get lipid panel in July along with a1c and cmp.  Follow up date in office to be determined but asked pt to schedule fasting labs July. Call week in advance to get scheduled.

## 2019-04-03 NOTE — Progress Notes (Signed)
   Subjective:    Patient ID: Gregory Tate, male    DOB: 1951-03-02, 68 y.o.   MRN: [id number]  HPI  Virtual Visit via Video Note  I connected with Gregory Tate on 04/03/19 at  9:20 AM EDT by a video enabled telemedicine application and verified that I am speaking with the correct person using two identifiers.   I discussed the limitations of evaluation and management by telemedicine and the availability of in person appointments. The patient expressed understanding and agreed to proceed.  Pt at home during virtual visit. I was at office. Visit done virtual due to pandemic.  History of Present Illness: Pt bp is good recently. He has lost weight and exercising more. No cardiac or neurologic signs or symptoms.  Pt is eating better than usual before pandemic.  No respiratory symptoms.   Observations/Objective: No acute distress. Normal affect.   Assessment and Plan: Bp well controlled continue current meds.  Mild elevated sugar in past but recent more exercise and better diet. Should help keep sugar down.  For high triglycerides advise low fat diet. Continue zocor. Will get lipid panel in July along with a1c and cmp.  Follow up date in office to be determined but asked pt to schedule fasting labs July. Call week in advance to get scheduled.  Follow Up Instructions:    I discussed the assessment and treatment plan with the patient. The patient was provided an opportunity to ask questions and all were answered. The patient agreed with the plan and demonstrated an understanding of the instructions.   The patient was advised to call back or seek an in-person evaluation if the symptoms worsen or if the condition fails to improve as anticipated.     Mackie Pai, PA-C    Review of Systems  Constitutional: Negative for chills, fatigue and fever.  Respiratory: Negative for cough, chest tightness, shortness of breath and wheezing.   Cardiovascular: Negative for chest pain  and palpitations.  Gastrointestinal: Negative for abdominal pain.  Musculoskeletal: Negative for back pain and myalgias.  Neurological: Negative for dizziness and headaches.  Hematological: Negative for adenopathy. Does not bruise/bleed easily.  Psychiatric/Behavioral: Negative for behavioral problems and confusion.       Objective:   Physical Exam        Assessment & Plan:

## 2019-04-16 DIAGNOSIS — L821 Other seborrheic keratosis: Secondary | ICD-10-CM | POA: Diagnosis not present

## 2019-04-16 DIAGNOSIS — L299 Pruritus, unspecified: Secondary | ICD-10-CM | POA: Diagnosis not present

## 2019-04-16 DIAGNOSIS — I781 Nevus, non-neoplastic: Secondary | ICD-10-CM | POA: Diagnosis not present

## 2019-04-16 DIAGNOSIS — L814 Other melanin hyperpigmentation: Secondary | ICD-10-CM | POA: Diagnosis not present

## 2019-04-16 DIAGNOSIS — D225 Melanocytic nevi of trunk: Secondary | ICD-10-CM | POA: Diagnosis not present

## 2019-04-16 DIAGNOSIS — B079 Viral wart, unspecified: Secondary | ICD-10-CM | POA: Diagnosis not present

## 2019-09-20 DIAGNOSIS — H04123 Dry eye syndrome of bilateral lacrimal glands: Secondary | ICD-10-CM | POA: Diagnosis not present

## 2019-09-20 DIAGNOSIS — H2513 Age-related nuclear cataract, bilateral: Secondary | ICD-10-CM | POA: Diagnosis not present

## 2020-02-06 DIAGNOSIS — L570 Actinic keratosis: Secondary | ICD-10-CM | POA: Diagnosis not present

## 2020-02-06 DIAGNOSIS — L820 Inflamed seborrheic keratosis: Secondary | ICD-10-CM | POA: Diagnosis not present

## 2020-02-06 DIAGNOSIS — L814 Other melanin hyperpigmentation: Secondary | ICD-10-CM | POA: Diagnosis not present

## 2020-04-01 NOTE — Progress Notes (Addendum)
This visit is being conducted via phone call  - after an attmept to do on video chat - due to the COVID-19 pandemic. This patient has given me verbal consent via phone to conduct this visit, patient states they are participating from their home address. Some vital signs may be absent or patient reported.   Patient identification: identified by name, DOB, and current address.   Subjective:   KAIKEA BRANTLY is a 69 y.o. male who presents for Medicare Annual/Subsequent preventive examination.  Volunteers at [employer]. Prior to pandemic, worked as Forensic scientist.   Review of Systems:   Home Safety/Smoke Alarms: Feels safe in home. Smoke alarms in place.  Lives in 1 story w/ wife and dogs.   Male:   CCS- last 01/17/17 w/ 10 yr recall per pt.     PSA-  Lab Results  Component Value Date   PSA 0.00 (L) 02/24/2015   PSA <0.01 ng/mL 01/20/2011       Objective:    Vitals: BP (!) 147/69 Comment: pt reported  Pulse 83   Wt 237 lb (107.5 kg)   BMI 31.27 kg/m   Body mass index is 31.27 kg/m.  Advanced Directives 04/02/2020 04/02/2019 03/29/2018  Does Patient Have a Medical Advance Directive? Yes Yes Yes  Type of Paramedic of [place];Living will [place];Living will [place];Living will  Does patient want to make changes to medical advance directive? No - Patient declined No - Patient declined No - Patient declined  Copy of [place] in Chart? No - copy requested No - copy requested No - copy requested    Tobacco Social History   Tobacco Use  Smoking Status Never Smoker  Smokeless Tobacco Never Used     Counseling given: Not Answered   Clinical Intake: Pain : No/denies pain     Past Medical History:  Diagnosis Date  . Chicken pox   . Dermatitis 01/28/2013  . Fatty liver    mild  . 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 Hyperlipidemia   . Hypertension   . Mumps   . Shingles 08/05/2017   Past Surgical History:  Procedure Laterality Date  . COLONOSCOPY  2007  . TONSILLECTOMY    . URINARY DIVERSION  2002   radical cystoprostatecomy and urinary diversion    Family History  Problem Relation Age of Onset  . Coronary artery disease Father 26       Deceased  . Hypertension Father   . Alzheimer's disease Father   . Hypertension Mother        Living  . Diabetes Brother        borderline #1  . Stroke Paternal Grandfather   . Heart attack Maternal Grandfather   . Arthritis Other   . Hypertension Brother        #2  . Heart disease Brother        #2  . Healthy Son        x1   Social History   Socioeconomic History  . Marital status: Married    Spouse name: Not on file  . Number of children: Not on file  . Years of education: Not on file  . Highest education level: Not on file  Occupational History  . Not on file  Tobacco Use  .  Smoking status: Never Smoker  . Smokeless tobacco: Never Used  Substance and Sexual Activity  . Alcohol use: Yes    Alcohol/week: 3.0 standard drinks    Types: 3 Cans of beer per week  . Drug use: No  . Sexual activity: Not Currently  Other Topics Concern  . 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      [place]   Married for 81 year    one son 3    New grandchild   Alcohol use-yes (social)  < 14 drinks per week.   Social Determinants of Health   Financial Resource Strain: Low Risk   . Difficulty of Paying Living Expenses: Not hard at all  Food Insecurity: No Food Insecurity  . Worried About Charity fundraiser in the Last Year: Never true  . Ran Out of Food in the Last Year: Never true  Transportation Needs: No Transportation Needs  . Lack of Transportation (Medical): No  . Lack of Transportation  (Non-Medical): No  Physical Activity:   . Days of Exercise per Week:   . Minutes of Exercise per Session:   Stress:   . Feeling of Stress :   Social Connections:   . Frequency of Communication with Friends and Family:   . Frequency of Social Gatherings with Friends and Family:   . Attends Religious Services:   . Active Member of Clubs or Organizations:   . Attends Archivist Meetings:   Marland Kitchen Marital Status:     Outpatient Encounter Medications as of 04/02/2020  Medication Sig  . aspirin 81 MG tablet Take 81 mg by mouth daily.    . Cholecalciferol (VITAMIN D3) 1000 UNITS tablet Take 1,000 Units by mouth daily.    Marland Kitchen lisinopril-hydrochlorothiazide (ZESTORETIC) 20-25 MG tablet TAKE 1 TABLET EVERY DAY  . mupirocin ointment (BACTROBAN) 2 % Apply thin film twice daily  . NON FORMULARY Cherry Extract as needed for Gout  . simvastatin (ZOCOR) 40 MG tablet TAKE 1 TABLET AT BEDTIME.  . cyclobenzaprine (FLEXERIL) 5 MG tablet Take 1 tablet (5 mg total) by mouth at bedtime. (Patient not taking: Reported on 04/02/2020)   No facility-administered encounter medications on file as of 04/02/2020.    Activities of Daily Living In your present state of health, do you have any difficulty performing the following activities: 04/02/2020  Hearing? N  Vision? N  Difficulty concentrating or making decisions? N  Walking or climbing stairs? N  Dressing or bathing? N  Doing errands, shopping? N  Preparing Food and eating ? N  Using the Toilet? N  In the past six months, have you accidently leaked urine? Y  Do you have problems with loss of bowel control? N  Managing your Medications? N  Managing your Finances? N  Housekeeping or managing your Housekeeping? N  Some recent data might be hidden    Patient Care Team: Saguier, Iris Pert as PCP - General (Internal Medicine) Franchot Gallo, MD as Consulting Physician (Urology) Selina Cooley, MD as Referring Physician (Dermatology)    Assessment:   This is a routine wellness examination for [place]. Physical assessment deferred to PCP.  Exercise Activities and Dietary recommendations Current Exercise Habits: Home exercise routine, Type of exercise: walking, Time (Minutes): 30, Frequency (Times/Week): 5, Weekly Exercise (Minutes/Week): 150, Intensity: Mild, Exercise limited by: None identified   Diet (meal preparation, eat out, water intake, caffeinated beverages, dairy  products, fruits and vegetables): in general, a "healthy" diet  , well balanced   Goals    . DIET - INCREASE WATER INTAKE       Fall Risk Fall Risk  04/02/2020 04/02/2019 03/29/2018 07/03/2017 01/15/2016  Falls in the past year? 1 0 No No No  Comment - - - Emmi Telephone Survey: data to providers prior to load -  Number falls in past yr: 0 - - - -  Injury with Fall? 1 - - - -  Follow up Education provided;Falls prevention discussed - - - -   Depression Screen PHQ 2/9 Scores 04/02/2020 04/02/2019 03/29/2018 01/15/2016  PHQ - 2 Score 0 0 0 0    Cognitive Function Ad8 score reviewed for issues:  Issues making decisions:no  Less interest in hobbies / activities:no  Repeats questions, stories (family complaining):no  Trouble using ordinary gadgets (microwave, computer, phone):no  Forgets the month or year: no  Mismanaging finances: no  Remembering appts:no  Daily problems with thinking and/or memory:no Ad8 score is=0         Immunization History  Administered Date(s) Administered  . Influenza Whole 11/30/2007, 08/28/2009  . Influenza-Unspecified 04/05/2015  . Pneumococcal Conjugate-13 02/24/2015  . Pneumococcal Polysaccharide-23 01/02/2017  . Td 05/22/2006  . Tdap 01/02/2017    Screening Tests Health Maintenance  Topic Date Due  . COVID-19 Vaccine (1) Never done  . INFLUENZA VACCINE  07/05/2020  . TETANUS/TDAP  01/02/2027  . COLONOSCOPY  01/17/2027  . Hepatitis C Screening  Completed  . PNA vac Low Risk Adult  Completed      Plan:    Please schedule your next medicare wellness visit with me in 1 yr.  Continue to eat heart healthy diet (full of fruits, vegetables, whole grains, lean protein, water--limit salt, fat, and sugar intake) and increase physical activity as tolerated.  Continue doing brain stimulating activities (puzzles, reading, adult coloring books, staying active) to keep memory sharp.   Bring a copy of your living will and/or healthcare power of attorney to your next office visit.   I have personally reviewed and noted the following in the patient's chart:   . Medical and social history . Use of alcohol, tobacco or illicit drugs  . Current medications and supplements . Functional ability and status . Nutritional status . Physical activity . Advanced directives . List of other physicians . Hospitalizations, surgeries, and ER visits in previous 12 months . Vitals . Screenings to include cognitive, depression, and falls . Referrals and appointments  In addition, I have reviewed and discussed with patient certain preventive protocols, quality metrics, and best practice recommendations. A written personalized care plan for preventive services as well as general preventive health recommendations were provided to patient.     Shela Nevin, [place]  04/02/2020    Reviewed and agree with Assessment & Plan of RN.  Mackie Pai, PA-C

## 2020-04-02 ENCOUNTER — Ambulatory Visit (INDEPENDENT_AMBULATORY_CARE_PROVIDER_SITE_OTHER): Payer: Medicare Other | Admitting: *Deleted

## 2020-04-02 ENCOUNTER — Encounter: Payer: Self-pay | Admitting: *Deleted

## 2020-04-02 ENCOUNTER — Other Ambulatory Visit: Payer: Self-pay

## 2020-04-02 VITALS — BP 147/69 | HR 83 | Wt 237.0 lb

## 2020-04-02 DIAGNOSIS — Z0000 Encounter for general adult medical examination without abnormal findings: Secondary | ICD-10-CM

## 2020-04-02 NOTE — Patient Instructions (Signed)
Please schedule your next medicare wellness visit with me in 1 yr.  Continue to eat heart healthy diet (full of fruits, vegetables, whole grains, lean protein, water--limit salt, fat, and sugar intake) and increase physical activity as tolerated.  Continue doing brain stimulating activities (puzzles, reading, adult coloring books, staying active) to keep memory sharp.   Bring a copy of your living will and/or healthcare power of attorney to your next office visit.   Gregory Tate , Thank you for taking time to come for your Medicare Wellness Visit. I appreciate your ongoing commitment to your health goals. Please review the following plan we discussed and let me know if I can assist you in the future.   These are the goals we discussed: Goals    . DIET - INCREASE WATER INTAKE       This is a list of the screening recommended for you and due dates:  Health Maintenance  Topic Date Due  . COVID-19 Vaccine (1) Never done  . Flu Shot  07/05/2020  . Tetanus Vaccine  01/02/2027  . Colon Cancer Screening  01/17/2027  .  Hepatitis C: One time screening is recommended by Center for Disease Control  (CDC) for  adults born from [birth year] through 1966.   Completed  . Pneumonia vaccines  Completed    Preventive Care 47 Years and Older, Male Preventive care refers to lifestyle choices and visits with your health care provider that can promote health and wellness. This includes:  A yearly physical exam. This is also called an annual well check.  Regular dental and eye exams.  Immunizations.  Screening for certain conditions.  Healthy lifestyle choices, such as diet and exercise. What can I expect for my preventive care visit? Physical exam Your health care provider will check:  Height and weight. These may be used to calculate body mass index (BMI), which is a measurement that tells if you are at a healthy weight.  Heart rate and blood pressure.  Your skin for abnormal  spots. Counseling Your health care provider may ask you questions about:  Alcohol, tobacco, and drug use.  Emotional well-being.  Home and relationship well-being.  Sexual activity.  Eating habits.  History of falls.  Memory and ability to understand (cognition).  Work and work Statistician. What immunizations do I need?  Influenza (flu) vaccine  This is recommended every year. Tetanus, diphtheria, and pertussis (Tdap) vaccine  You may need a Td booster every 10 years. Varicella (chickenpox) vaccine  You may need this vaccine if you have not already been vaccinated. Zoster (shingles) vaccine  You may need this after age 34. Pneumococcal conjugate (PCV13) vaccine  One dose is recommended after age 39. Pneumococcal polysaccharide (PPSV23) vaccine  One dose is recommended after age 21. Measles, mumps, and rubella (MMR) vaccine  You may need at least one dose of MMR if you were born in 1957 or later. You may also need a second dose. Meningococcal conjugate (MenACWY) vaccine  You may need this if you have certain conditions. Hepatitis A vaccine  You may need this if you have certain conditions or if you travel or work in places where you may be exposed to hepatitis A. Hepatitis B vaccine  You may need this if you have certain conditions or if you travel or work in places where you may be exposed to hepatitis B. Haemophilus influenzae type b (Hib) vaccine  You may need this if you have certain conditions. You may receive vaccines as individual  doses or as more than one vaccine together in one shot (combination vaccines). Talk with your health care provider about the risks and benefits of combination vaccines. What tests do I need? Blood tests  Lipid and cholesterol levels. These may be checked every 5 years, or more frequently depending on your overall health.  Hepatitis C test.  Hepatitis B test. Screening  Lung cancer screening. You may have this screening  every year starting at age 1 if you have a 30-pack-year history of smoking and currently smoke or have quit within the past 15 years.  Colorectal cancer screening. All adults should have this screening starting at age 72 and continuing until age 62. Your health care provider may recommend screening at age 24 if you are at increased risk. You will have tests every 1-10 years, depending on your results and the type of screening test.  Prostate cancer screening. Recommendations will vary depending on your family history and other risks.  Diabetes screening. This is done by checking your blood sugar (glucose) after you have not eaten for a while (fasting). You may have this done every 1-3 years.  Abdominal aortic aneurysm (AAA) screening. You may need this if you are a current or former smoker.  Sexually transmitted disease (STD) testing. Follow these instructions at home: Eating and drinking  Eat a diet that includes fresh fruits and vegetables, whole grains, lean protein, and low-fat dairy products. Limit your intake of foods with high amounts of sugar, saturated fats, and salt.  Take vitamin and mineral supplements as recommended by your health care provider.  Do not drink alcohol if your health care provider tells you not to drink.  If you drink alcohol: ? Limit how much you have to 0-2 drinks a day. ? Be aware of how much alcohol is in your drink. In the [place] one drink equals one 12 oz bottle of beer (355 mL), one 5 oz glass of wine (148 mL), or one 1 oz glass of hard liquor (44 mL). Lifestyle  Take daily care of your teeth and gums.  Stay active. Exercise for at least 30 minutes on 5 or more days each week.  Do not use any products that contain nicotine or tobacco, such as cigarettes, e-cigarettes, and chewing tobacco. If you need help quitting, ask your health care provider.  If you are sexually active, practice safe sex. Use a condom or other form of protection to prevent STIs  (sexually transmitted infections).  Talk with your health care provider about taking a low-dose aspirin or statin. What's next?  Visit your health care provider once a year for a well check visit.  Ask your health care provider how often you should have your eyes and teeth checked.  Stay up to date on all vaccines. This information is not intended to replace advice given to you by your health care provider. Make sure you discuss any questions you have with your health care provider. Document Revised: 11/15/2018 Document Reviewed: 11/15/2018 Elsevier Patient Education  2020 [employer].

## 2020-04-06 ENCOUNTER — Telehealth: Payer: Self-pay | Admitting: Medical

## 2020-04-06 ENCOUNTER — Other Ambulatory Visit: Payer: Self-pay

## 2020-04-06 ENCOUNTER — Ambulatory Visit (INDEPENDENT_AMBULATORY_CARE_PROVIDER_SITE_OTHER): Payer: Medicare Other | Admitting: Medical

## 2020-04-06 ENCOUNTER — Encounter: Payer: Self-pay | Admitting: Medical

## 2020-04-06 VITALS — BP 138/74 | HR 61 | Temp 97.5°F | Resp 18 | Ht 73.0 in | Wt 238.0 lb

## 2020-04-06 DIAGNOSIS — H1130 Conjunctival hemorrhage, unspecified eye: Secondary | ICD-10-CM

## 2020-04-06 DIAGNOSIS — R0789 Other chest pain: Secondary | ICD-10-CM | POA: Diagnosis not present

## 2020-04-06 DIAGNOSIS — R739 Hyperglycemia, unspecified: Secondary | ICD-10-CM

## 2020-04-06 DIAGNOSIS — I10 Essential (primary) hypertension: Secondary | ICD-10-CM

## 2020-04-06 DIAGNOSIS — E785 Hyperlipidemia, unspecified: Secondary | ICD-10-CM | POA: Diagnosis not present

## 2020-04-06 DIAGNOSIS — Z125 Encounter for screening for malignant neoplasm of prostate: Secondary | ICD-10-CM

## 2020-04-06 LAB — LIPID PANEL
Cholesterol: 145 mg/dL (ref 0–200)
HDL: 53.9 mg/dL (ref >39.00)
LDL Cholesterol: 70 mg/dL (ref 0–99)
NonHDL: 91.48
Total CHOL/HDL Ratio: 3
Triglycerides: 108 mg/dL (ref 0.0–149.0)
VLDL: 21.6 mg/dL (ref 0.0–40.0)

## 2020-04-06 LAB — COMPREHENSIVE METABOLIC PANEL
ALT: 26 U/L (ref 0–53)
AST: 22 U/L (ref 0–37)
Albumin: 4.2 g/dL (ref 3.5–5.2)
Alkaline Phosphatase: 39 U/L (ref 39–117)
BUN: 24 mg/dL — ABNORMAL HIGH (ref 6–23)
CO2: 30 mEq/L (ref 19–32)
Calcium: 9.3 mg/dL (ref 8.4–10.5)
Chloride: 103 mEq/L (ref 96–112)
Creatinine, Ser: 0.96 mg/dL (ref 0.40–1.50)
GFR: 77.38 mL/min (ref >60.00)
Glucose, Bld: 134 mg/dL — ABNORMAL HIGH (ref 70–99)
Potassium: 4.5 mEq/L (ref 3.5–5.1)
Sodium: 136 mEq/L (ref 135–145)
Total Bilirubin: 0.8 mg/dL (ref 0.2–1.2)
Total Protein: 6.6 g/dL (ref 6.0–8.3)

## 2020-04-06 LAB — HEMOGLOBIN A1C: Hgb A1c MFr Bld: 6.3 % (ref 4.6–6.5)

## 2020-04-06 LAB — TROPONIN I (HIGH SENSITIVITY): High Sens Troponin I: 4 ng/L (ref 2–17)

## 2020-04-06 MED ORDER — SIMVASTATIN 40 MG PO TABS: 40.0000 mg | ORAL_TABLET | Freq: Every day | ORAL | 3 refills | Status: DC

## 2020-04-06 NOTE — Progress Notes (Signed)
Subjective:    Patient ID: Gregory Tate, male    DOB: 10-Nov-1951, 69 y.o.   MRN: [id number]  HPI  Pt in for follow up.  It has been 1 year or more since last visit.  Pt has had covid vaccine already.  Pt bp is well controlled today. Pt is still on zesoretic.   Pt has high cholesterol and is on simvastatin.   Pt last a1c was 6.6.   He has been exercising. He has been walking about 10,000 steps a day. He is volunteering fixing and upgrading trails.  Pt wife is vegan and    Pt states he will randomly get small blood vessel that burst. Last one happened about 4 weeks ago. By description unclear if subjconctival hemorhage by description.  Also some left upper chest pain that last for about 5 seconds on chest wall movement. Last for 5 seconds then will resolve. Also some left upper back pain that associates with pain in chest. No shoulder pain. No arm pain. No jaw pain, sob or sweating.   Last time had atypical chest pain was 3 days ago.    Review of Systems  Constitutional: Negative for chills, fatigue and fever.  Respiratory: Negative for cough, chest tightness, shortness of breath and wheezing.   Cardiovascular: Negative for chest pain and palpitations.  Gastrointestinal: Negative for abdominal pain, constipation, diarrhea and vomiting.  Musculoskeletal: Negative for back pain, neck pain and neck stiffness.  Skin: Negative for rash.  Neurological: Negative for dizziness and headaches.  Psychiatric/Behavioral: Negative for behavioral problems, confusion, decreased concentration and dysphoric mood. The patient is not nervous/anxious.     Past Medical History:  Diagnosis Date  . Chicken pox   . Dermatitis 01/28/2013  . Fatty liver    mild  . 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 Hyperlipidemia   . Hypertension   . Mumps   . Shingles 08/05/2017      Social History   Socioeconomic History  . Marital status: Married    Spouse name: Not on file  . Number of children: Not on file  . Years of education: Not on file  . Highest education level: Not on file  Occupational History  . Not on file  Tobacco Use  . Smoking status: Never Smoker  . Smokeless tobacco: Never Used  Substance and Sexual Activity  . Alcohol use: Yes    Alcohol/week: 3.0 standard drinks    Types: 3 Cans of beer per week  . Drug use: No  . Sexual activity: Not Currently  Other Topics Concern  . 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      [place]   Married for 69 year    one son 52    New grandchild   Alcohol use-yes (social)  < 14 drinks per week.   Social Determinants of Health   Financial Resource Strain: Low Risk   . Difficulty of Paying Living Expenses: Not hard at all  Food Insecurity: No Food Insecurity  . Worried About Charity fundraiser in the Last Year: Never true  . Ran Out of Food in the Last Year: Never true  Transportation Needs: No Transportation Needs  . Lack of Transportation (Medical): No  . Lack of Transportation (  Non-Medical): No  Physical Activity:   . Days of Exercise per Week:   . Minutes of Exercise per Session:   Stress:   . Feeling of Stress :   Social Connections:   . Frequency of Communication with Friends and Family:   . Frequency of Social Gatherings with Friends and Family:   . Attends Religious Services:   . Active Member of Clubs or Organizations:   . Attends Archivist Meetings:   Marland Kitchen Marital Status:   Intimate Partner Violence:   . Fear of Current or Ex-Partner:   . Emotionally Abused:   Marland Kitchen Physically Abused:   . Sexually Abused:     Past Surgical History:  Procedure Laterality Date  . COLONOSCOPY  2007  . TONSILLECTOMY    . URINARY DIVERSION  2002   radical cystoprostatecomy and urinary diversion      Family History  Problem Relation Age of Onset  . Coronary artery disease Father 78       Deceased  . Hypertension Father   . Alzheimer's disease Father   . Hypertension Mother        Living  . Diabetes Brother        borderline #1  . Stroke Paternal Grandfather   . Heart attack Maternal Grandfather   . Arthritis Other   . Hypertension Brother        #2  . Heart disease Brother        #2  . Healthy Son        x1    No Known Allergies  Current Outpatient Medications on File Prior to Visit  Medication Sig Dispense Refill  . aspirin 81 MG tablet Take 81 mg by mouth daily.      . Cholecalciferol (VITAMIN D3) 1000 UNITS tablet Take 1,000 Units by mouth daily.      . cyclobenzaprine (FLEXERIL) 5 MG tablet Take 1 tablet (5 mg total) by mouth at bedtime. (Patient not taking: Reported on 04/02/2020) 7 tablet 1  . lisinopril-hydrochlorothiazide (ZESTORETIC) 20-25 MG tablet TAKE 1 TABLET EVERY DAY 90 tablet 3  . mupirocin ointment (BACTROBAN) 2 % Apply thin film twice daily 22 g 0  . NON FORMULARY Cherry Extract as needed for Gout    . simvastatin (ZOCOR) 40 MG tablet TAKE 1 TABLET AT BEDTIME. 90 tablet 3   No current facility-administered medications on file prior to visit.    BP 138/74 (BP Location: Right Arm, Patient Position: Sitting, Cuff Size: Large)   Pulse 61   Temp (!) 97.5 F (36.4 C) (Temporal)   Resp 18   Ht 6\' 1"  (1.854 m)   Wt 238 lb (108 kg)   SpO2 99%   BMI 31.40 kg/m       Objective:   Physical Exam  General Mental Status- Alert. General Appearance- Not in acute distress.   Skin General: Color- Normal Color. Moisture- Normal Moisture.  Neck Carotid Arteries- Normal color. Moisture- Normal Moisture. No carotid bruits. No JVD.  Chest and Lung Exam Auscultation: Breath Sounds:-Normal.  Cardiovascular Auscultation:Rythm- Regular. Murmurs & Other Heart Sounds:Auscultation of the heart reveals- No Murmurs.  Abdomen Inspection:-Inspeection Normal.  Palpation/Percussion:Note:No mass. Palpation and Percussion of the abdomen reveal- Non Tender, Non Distended + BS, no rebound or guarding.    Neurologic Cranial Nerve exam:- CN III-XII intact(No nystagmus), symmetric smile. Strength:- 5/5 equal and symmetric strength both upper and lower extremities.   Anterior chest- no present pain on palpation. But left upper outer pectoralis is area  where he will get pain.  Eyes- peerl bilateral,  No subconjunctival hermorrhage now.    Assessment & Plan:  Your blood pressure is well controlled today.  Continue on current BP medication.  For hyperlipidemia and elevated blood sugar in the past, will get lipid panel, CMP and A1c today.  You have some recent atypical type transient type chest pain.  Pain is with movement and only last for about 5 seconds of this seems to be musculoskeletal type pain as opposed to cardiac type pain.  Last event was 3 days ago.  For caution sake we will get EKG but unlikely to be elevated.  Your EKG today showed sinus rhythm.  When compared to previous EKG done in 2016 there has been no significant change.  Need to watch you closely and if you develop chest pain that persists with other associated symptoms or chest pain that does not resolve then recommend being seen by emergency department.  Also could consider outpatient cardiologist referral as well is atypical pain were to persist/become more frequent.  You also report history of possible subconjunctival hemorrhage.  If you have that again you recommend checking your blood pressure at that time.  Also recommend same-day evaluation we can confirm that some conjunctival hemorrhage is diagnosis.  Follow-up date to be determined after lab review.  Time spent with patient today was 45 minutes which consisted of chart review, discussing diagnoses, work up treatment, answering questions  and documentation.

## 2020-04-06 NOTE — Telephone Encounter (Signed)
Refilled simvastatin rx today.

## 2020-04-06 NOTE — Patient Instructions (Addendum)
Your blood pressure is well controlled today.  Continue on current BP medication.  For hyperlipidemia and elevated blood sugar in the past, will get lipid panel, CMP and A1c today.  You have some recent atypical type transient type chest pain.  Pain is with movement and only last for about 5 seconds of this so seems to be musculoskeletal type pain as opposed to cardiac type pain.  Last event was 3 days ago.  For caution sake we will get troponin but unlikely to be elevated.  Your EKG today showed sinus rhythm with no acute ischemia.  When compared to previous EKG done in 2016 there has been no significant change.  Need to watch you closely and if you develop chest pain that persists with other associated symptoms or chest pain that does not resolve then recommend being seen by emergency department.  Also could consider outpatient cardiologist referral as well is atypical pain were to persist/become more frequent.  You also report history of possible subconjunctival hemorrhage.  If you have that again you recommend checking your blood pressure at that time.  Also recommend same-day evaluation we can confirm that some conjunctival hemorrhage is diagnosis.  Follow-up date to be determined after lab review.

## 2020-05-26 ENCOUNTER — Encounter: Payer: Self-pay | Admitting: Medical

## 2020-05-27 ENCOUNTER — Telehealth: Payer: Self-pay

## 2020-05-27 MED ORDER — LISINOPRIL-HYDROCHLOROTHIAZIDE 20-25 MG PO TABS: 1.0000 | ORAL_TABLET | Freq: Every day | ORAL | 3 refills | Status: DC

## 2020-05-27 MED ORDER — SIMVASTATIN 40 MG PO TABS: 40.0000 mg | ORAL_TABLET | Freq: Every day | ORAL | 3 refills | Status: DC

## 2020-05-27 NOTE — Addendum Note (Signed)
Addended by: Jeronimo Greaves on: 05/27/2020 09:02 AM   Modules accepted: Orders

## 2020-05-27 NOTE — Telephone Encounter (Signed)
medication refill.

## 2020-06-10 DIAGNOSIS — M9903 Segmental and somatic dysfunction of lumbar region: Secondary | ICD-10-CM | POA: Diagnosis not present

## 2020-06-10 DIAGNOSIS — M9901 Segmental and somatic dysfunction of cervical region: Secondary | ICD-10-CM | POA: Diagnosis not present

## 2020-06-10 DIAGNOSIS — M9902 Segmental and somatic dysfunction of thoracic region: Secondary | ICD-10-CM | POA: Diagnosis not present

## 2020-06-10 DIAGNOSIS — M5386 Other specified dorsopathies, lumbar region: Secondary | ICD-10-CM | POA: Diagnosis not present

## 2020-06-17 DIAGNOSIS — M9903 Segmental and somatic dysfunction of lumbar region: Secondary | ICD-10-CM | POA: Diagnosis not present

## 2020-06-17 DIAGNOSIS — M5386 Other specified dorsopathies, lumbar region: Secondary | ICD-10-CM | POA: Diagnosis not present

## 2020-06-17 DIAGNOSIS — M9902 Segmental and somatic dysfunction of thoracic region: Secondary | ICD-10-CM | POA: Diagnosis not present

## 2020-06-17 DIAGNOSIS — M9901 Segmental and somatic dysfunction of cervical region: Secondary | ICD-10-CM | POA: Diagnosis not present

## 2020-06-24 DIAGNOSIS — M9901 Segmental and somatic dysfunction of cervical region: Secondary | ICD-10-CM | POA: Diagnosis not present

## 2020-06-24 DIAGNOSIS — M9902 Segmental and somatic dysfunction of thoracic region: Secondary | ICD-10-CM | POA: Diagnosis not present

## 2020-06-24 DIAGNOSIS — M9903 Segmental and somatic dysfunction of lumbar region: Secondary | ICD-10-CM | POA: Diagnosis not present

## 2020-06-24 DIAGNOSIS — M5386 Other specified dorsopathies, lumbar region: Secondary | ICD-10-CM | POA: Diagnosis not present

## 2020-07-02 DIAGNOSIS — M5386 Other specified dorsopathies, lumbar region: Secondary | ICD-10-CM | POA: Diagnosis not present

## 2020-07-02 DIAGNOSIS — M9901 Segmental and somatic dysfunction of cervical region: Secondary | ICD-10-CM | POA: Diagnosis not present

## 2020-07-02 DIAGNOSIS — M9903 Segmental and somatic dysfunction of lumbar region: Secondary | ICD-10-CM | POA: Diagnosis not present

## 2020-07-02 DIAGNOSIS — M9902 Segmental and somatic dysfunction of thoracic region: Secondary | ICD-10-CM | POA: Diagnosis not present

## 2020-08-19 DIAGNOSIS — R05 Cough: Secondary | ICD-10-CM | POA: Diagnosis not present

## 2020-08-19 DIAGNOSIS — Z20822 Contact with and (suspected) exposure to covid-19: Secondary | ICD-10-CM | POA: Diagnosis not present

## 2020-09-25 DIAGNOSIS — H01009 Unspecified blepharitis unspecified eye, unspecified eyelid: Secondary | ICD-10-CM | POA: Diagnosis not present

## 2020-09-25 DIAGNOSIS — H2513 Age-related nuclear cataract, bilateral: Secondary | ICD-10-CM | POA: Diagnosis not present

## 2020-09-25 DIAGNOSIS — H04123 Dry eye syndrome of bilateral lacrimal glands: Secondary | ICD-10-CM | POA: Diagnosis not present

## 2020-12-02 DIAGNOSIS — Z20822 Contact with and (suspected) exposure to covid-19: Secondary | ICD-10-CM | POA: Diagnosis not present

## 2020-12-02 DIAGNOSIS — R059 Cough, unspecified: Secondary | ICD-10-CM | POA: Diagnosis not present

## 2020-12-13 DIAGNOSIS — Z23 Encounter for immunization: Secondary | ICD-10-CM | POA: Diagnosis not present

## 2020-12-19 ENCOUNTER — Encounter: Payer: Self-pay | Admitting: Medical

## 2020-12-30 DIAGNOSIS — Z7184 Encounter for health counseling related to travel: Secondary | ICD-10-CM | POA: Diagnosis not present

## 2020-12-30 DIAGNOSIS — Z20822 Contact with and (suspected) exposure to covid-19: Secondary | ICD-10-CM | POA: Diagnosis not present

## 2021-03-03 DIAGNOSIS — M9904 Segmental and somatic dysfunction of sacral region: Secondary | ICD-10-CM | POA: Diagnosis not present

## 2021-03-03 DIAGNOSIS — M9903 Segmental and somatic dysfunction of lumbar region: Secondary | ICD-10-CM | POA: Diagnosis not present

## 2021-03-03 DIAGNOSIS — M9901 Segmental and somatic dysfunction of cervical region: Secondary | ICD-10-CM | POA: Diagnosis not present

## 2021-03-03 DIAGNOSIS — M5386 Other specified dorsopathies, lumbar region: Secondary | ICD-10-CM | POA: Diagnosis not present

## 2021-03-03 DIAGNOSIS — M9902 Segmental and somatic dysfunction of thoracic region: Secondary | ICD-10-CM | POA: Diagnosis not present

## 2021-03-08 DIAGNOSIS — M9903 Segmental and somatic dysfunction of lumbar region: Secondary | ICD-10-CM | POA: Diagnosis not present

## 2021-03-08 DIAGNOSIS — M9902 Segmental and somatic dysfunction of thoracic region: Secondary | ICD-10-CM | POA: Diagnosis not present

## 2021-03-08 DIAGNOSIS — M5386 Other specified dorsopathies, lumbar region: Secondary | ICD-10-CM | POA: Diagnosis not present

## 2021-03-08 DIAGNOSIS — M9901 Segmental and somatic dysfunction of cervical region: Secondary | ICD-10-CM | POA: Diagnosis not present

## 2021-03-08 DIAGNOSIS — M9904 Segmental and somatic dysfunction of sacral region: Secondary | ICD-10-CM | POA: Diagnosis not present

## 2021-03-10 DIAGNOSIS — M9904 Segmental and somatic dysfunction of sacral region: Secondary | ICD-10-CM | POA: Diagnosis not present

## 2021-03-10 DIAGNOSIS — M5386 Other specified dorsopathies, lumbar region: Secondary | ICD-10-CM | POA: Diagnosis not present

## 2021-03-10 DIAGNOSIS — M9903 Segmental and somatic dysfunction of lumbar region: Secondary | ICD-10-CM | POA: Diagnosis not present

## 2021-03-10 DIAGNOSIS — M9902 Segmental and somatic dysfunction of thoracic region: Secondary | ICD-10-CM | POA: Diagnosis not present

## 2021-03-10 DIAGNOSIS — M9901 Segmental and somatic dysfunction of cervical region: Secondary | ICD-10-CM | POA: Diagnosis not present

## 2021-03-16 DIAGNOSIS — M9902 Segmental and somatic dysfunction of thoracic region: Secondary | ICD-10-CM | POA: Diagnosis not present

## 2021-03-16 DIAGNOSIS — M9901 Segmental and somatic dysfunction of cervical region: Secondary | ICD-10-CM | POA: Diagnosis not present

## 2021-03-16 DIAGNOSIS — M9903 Segmental and somatic dysfunction of lumbar region: Secondary | ICD-10-CM | POA: Diagnosis not present

## 2021-03-16 DIAGNOSIS — M9904 Segmental and somatic dysfunction of sacral region: Secondary | ICD-10-CM | POA: Diagnosis not present

## 2021-03-16 DIAGNOSIS — M5386 Other specified dorsopathies, lumbar region: Secondary | ICD-10-CM | POA: Diagnosis not present

## 2021-03-18 DIAGNOSIS — M5386 Other specified dorsopathies, lumbar region: Secondary | ICD-10-CM | POA: Diagnosis not present

## 2021-03-18 DIAGNOSIS — M9903 Segmental and somatic dysfunction of lumbar region: Secondary | ICD-10-CM | POA: Diagnosis not present

## 2021-03-18 DIAGNOSIS — M9904 Segmental and somatic dysfunction of sacral region: Secondary | ICD-10-CM | POA: Diagnosis not present

## 2021-03-18 DIAGNOSIS — M9902 Segmental and somatic dysfunction of thoracic region: Secondary | ICD-10-CM | POA: Diagnosis not present

## 2021-03-18 DIAGNOSIS — M9901 Segmental and somatic dysfunction of cervical region: Secondary | ICD-10-CM | POA: Diagnosis not present

## 2021-03-23 DIAGNOSIS — M9901 Segmental and somatic dysfunction of cervical region: Secondary | ICD-10-CM | POA: Diagnosis not present

## 2021-03-23 DIAGNOSIS — M5386 Other specified dorsopathies, lumbar region: Secondary | ICD-10-CM | POA: Diagnosis not present

## 2021-03-23 DIAGNOSIS — M9902 Segmental and somatic dysfunction of thoracic region: Secondary | ICD-10-CM | POA: Diagnosis not present

## 2021-03-23 DIAGNOSIS — M9903 Segmental and somatic dysfunction of lumbar region: Secondary | ICD-10-CM | POA: Diagnosis not present

## 2021-03-23 DIAGNOSIS — M9904 Segmental and somatic dysfunction of sacral region: Secondary | ICD-10-CM | POA: Diagnosis not present

## 2021-03-25 DIAGNOSIS — M5386 Other specified dorsopathies, lumbar region: Secondary | ICD-10-CM | POA: Diagnosis not present

## 2021-03-25 DIAGNOSIS — M9902 Segmental and somatic dysfunction of thoracic region: Secondary | ICD-10-CM | POA: Diagnosis not present

## 2021-03-25 DIAGNOSIS — M9904 Segmental and somatic dysfunction of sacral region: Secondary | ICD-10-CM | POA: Diagnosis not present

## 2021-03-25 DIAGNOSIS — M9903 Segmental and somatic dysfunction of lumbar region: Secondary | ICD-10-CM | POA: Diagnosis not present

## 2021-03-25 DIAGNOSIS — M9901 Segmental and somatic dysfunction of cervical region: Secondary | ICD-10-CM | POA: Diagnosis not present

## 2021-03-30 DIAGNOSIS — M9901 Segmental and somatic dysfunction of cervical region: Secondary | ICD-10-CM | POA: Diagnosis not present

## 2021-03-30 DIAGNOSIS — M9903 Segmental and somatic dysfunction of lumbar region: Secondary | ICD-10-CM | POA: Diagnosis not present

## 2021-03-30 DIAGNOSIS — M5386 Other specified dorsopathies, lumbar region: Secondary | ICD-10-CM | POA: Diagnosis not present

## 2021-03-30 DIAGNOSIS — M9904 Segmental and somatic dysfunction of sacral region: Secondary | ICD-10-CM | POA: Diagnosis not present

## 2021-03-30 DIAGNOSIS — M9902 Segmental and somatic dysfunction of thoracic region: Secondary | ICD-10-CM | POA: Diagnosis not present

## 2021-04-06 DIAGNOSIS — M9901 Segmental and somatic dysfunction of cervical region: Secondary | ICD-10-CM | POA: Diagnosis not present

## 2021-04-06 DIAGNOSIS — M9902 Segmental and somatic dysfunction of thoracic region: Secondary | ICD-10-CM | POA: Diagnosis not present

## 2021-04-06 DIAGNOSIS — M9904 Segmental and somatic dysfunction of sacral region: Secondary | ICD-10-CM | POA: Diagnosis not present

## 2021-04-06 DIAGNOSIS — M9903 Segmental and somatic dysfunction of lumbar region: Secondary | ICD-10-CM | POA: Diagnosis not present

## 2021-04-06 DIAGNOSIS — M5386 Other specified dorsopathies, lumbar region: Secondary | ICD-10-CM | POA: Diagnosis not present

## 2021-04-14 DIAGNOSIS — M9901 Segmental and somatic dysfunction of cervical region: Secondary | ICD-10-CM | POA: Diagnosis not present

## 2021-04-14 DIAGNOSIS — M9904 Segmental and somatic dysfunction of sacral region: Secondary | ICD-10-CM | POA: Diagnosis not present

## 2021-04-14 DIAGNOSIS — M9902 Segmental and somatic dysfunction of thoracic region: Secondary | ICD-10-CM | POA: Diagnosis not present

## 2021-04-14 DIAGNOSIS — M5386 Other specified dorsopathies, lumbar region: Secondary | ICD-10-CM | POA: Diagnosis not present

## 2021-04-14 DIAGNOSIS — M9903 Segmental and somatic dysfunction of lumbar region: Secondary | ICD-10-CM | POA: Diagnosis not present

## 2021-04-22 ENCOUNTER — Encounter: Payer: Self-pay | Admitting: Emergency Medicine

## 2021-04-22 ENCOUNTER — Telehealth: Payer: Medicare Other | Admitting: Emergency Medicine

## 2021-04-22 DIAGNOSIS — M9903 Segmental and somatic dysfunction of lumbar region: Secondary | ICD-10-CM | POA: Diagnosis not present

## 2021-04-22 DIAGNOSIS — L089 Local infection of the skin and subcutaneous tissue, unspecified: Secondary | ICD-10-CM

## 2021-04-22 DIAGNOSIS — M9904 Segmental and somatic dysfunction of sacral region: Secondary | ICD-10-CM | POA: Diagnosis not present

## 2021-04-22 DIAGNOSIS — M9901 Segmental and somatic dysfunction of cervical region: Secondary | ICD-10-CM | POA: Diagnosis not present

## 2021-04-22 DIAGNOSIS — M5386 Other specified dorsopathies, lumbar region: Secondary | ICD-10-CM | POA: Diagnosis not present

## 2021-04-22 DIAGNOSIS — M9902 Segmental and somatic dysfunction of thoracic region: Secondary | ICD-10-CM | POA: Diagnosis not present

## 2021-04-22 MED ORDER — DOXYCYCLINE HYCLATE 100 MG PO CAPS
100.0000 mg | ORAL_CAPSULE | Freq: Two times a day (BID) | ORAL | 0 refills | Status: DC
Start: 2021-04-22 — End: 2021-06-18

## 2021-04-22 NOTE — Progress Notes (Signed)
Mr. Gregory Tate, [place] are scheduled for a virtual visit with your provider today.    Just as we do with appointments in the office, we must obtain your consent to participate.  Your consent will be active for this visit and any virtual visit you may have with one of our providers in the next 365 days.    If you have a MyChart account, I can also send a copy of this consent to you electronically.  All virtual visits are billed to your insurance company just like a traditional visit in the office.  As this is a virtual visit, video technology does not allow for your provider to perform a traditional examination.  This may limit your provider's ability to fully assess your condition.  If your provider identifies any concerns that need to be evaluated in person or the need to arrange testing such as labs, EKG, etc, we will make arrangements to do so.    Although advances in technology are sophisticated, we cannot ensure that it will always work on either your end or our end.  If the connection with a video visit is poor, we may have to switch to a telephone visit.  With either a video or telephone visit, we are not always able to ensure that we have a secure connection.   I need to obtain your verbal consent now.   Are you willing to proceed with your visit today?   Gregory Tate has provided verbal consent on 04/22/2021 for a virtual visit (video or telephone).   Gregory Circle, PA-C 04/22/2021  12:30 PM    Virtual Visit via Video   I connected with patient on 04/22/21 at 12:45 PM EDT by a telemedicine application and verified that I am speaking with the correct person using two identifiers.  Location patient: Home Location provider: [place] participating in the virtual visit: Patient, Provider  I discussed the limitations of evaluation and management by telemedicine and the availability of in person appointments. The patient expressed understanding and agreed to  proceed.  Subjective:   HPI:   Patient presents via Telephone today with a chief complaint of left middle toe pain and swelling.  Symptoms started on Tuesday and worsened on Wednesday.  Symptoms started after pressure washing the deck.  He thinks that he was bitten by a spider. He reports redness and swelling of the middle toe and redness to mid-foot. He denies fever.  He has tried using biofreeze.  He states that this doesn't seem like gout.  He is not diabetic.  ROS:   See pertinent positives and negatives per HPI.  Patient Active Problem List   Diagnosis Date Noted  . Encounter for screening colonoscopy 01/30/2017  . Need for shingles vaccine 01/15/2016  . Incontinence 02/20/2014  . Dermatitis 01/28/2013  . Leg swelling 05/24/2012  . Knee pain 05/24/2012  . Myalgia 03/25/2011  . HYPERGLYCEMIA 01/24/2011  . ERECTILE DYSFUNCTION, ORGANIC 08/28/2009  . NEOPLASM, MALIGNANT, BLADDER, HX OF 07/18/2008  . Hyperlipidemia 11/30/2007  . Essential hypertension 11/30/2007  . BACK PAIN 07/04/2007    Social History   Tobacco Use  . Smoking status: Never Smoker  . Smokeless tobacco: Never Used  Substance Use Topics  . Alcohol use: Yes    Alcohol/week: 3.0 standard drinks    Types: 3 Cans of beer per week    Current Outpatient Medications:  .  aspirin 81 MG tablet, Take 81 mg by mouth daily.  , Disp: , Rfl:  .  Cholecalciferol (VITAMIN D3) 1000 UNITS tablet, Take 1,000 Units by mouth daily.  , Disp: , Rfl:  .  cyclobenzaprine (FLEXERIL) 5 MG tablet, Take 1 tablet (5 mg total) by mouth at bedtime. (Patient not taking: Reported on 04/02/2020), Disp: 7 tablet, Rfl: 1 .  lisinopril-hydrochlorothiazide (ZESTORETIC) 20-25 MG tablet, Take 1 tablet by mouth daily., Disp: 90 tablet, Rfl: 3 .  mupirocin ointment (BACTROBAN) 2 %, Apply thin film twice daily, Disp: 22 g, Rfl: 0 .  NON FORMULARY, Cherry Extract as needed for Gout, Disp: , Rfl:  .  simvastatin (ZOCOR) 40 MG tablet, Take 1 tablet  (40 mg total) by mouth at bedtime., Disp: 90 tablet, Rfl: 3  No Known Allergies  Objective:   There were no vitals taken for this visit.  Telephone visit No labored breathing.  Speech is clear and coherent with logical content.  Patient is alert and oriented at baseline.  Self reports swelling and redness of the left middle toe  Assessment and Plan:   1. Toe infection  - Trial Doxycycline 100mg  BID x 10 days  - If not improving or if worsening in 48 hours, f/u in-person   Gregory Circle, PA-C 04/22/2021

## 2021-05-05 DIAGNOSIS — M9904 Segmental and somatic dysfunction of sacral region: Secondary | ICD-10-CM | POA: Diagnosis not present

## 2021-05-05 DIAGNOSIS — M9901 Segmental and somatic dysfunction of cervical region: Secondary | ICD-10-CM | POA: Diagnosis not present

## 2021-05-05 DIAGNOSIS — M9902 Segmental and somatic dysfunction of thoracic region: Secondary | ICD-10-CM | POA: Diagnosis not present

## 2021-05-05 DIAGNOSIS — M5386 Other specified dorsopathies, lumbar region: Secondary | ICD-10-CM | POA: Diagnosis not present

## 2021-05-05 DIAGNOSIS — M9903 Segmental and somatic dysfunction of lumbar region: Secondary | ICD-10-CM | POA: Diagnosis not present

## 2021-05-17 NOTE — Progress Notes (Addendum)
Subjective:   Gregory Tate is a 70 y.o. male who presents for Medicare Annual/Subsequent preventive examination.  Review of Systems     Cardiac Risk Factors include: advanced age (>44men, >23 women);male gender;obesity (BMI >30kg/m2);dyslipidemia;hypertension     Objective:    Today's Vitals   05/18/21 1130 05/18/21 1132  BP: 136/84   Pulse: 68   Resp: 16   Temp: 98.1 F (36.7 C)   TempSrc: Temporal   SpO2: 98%   Weight: 242 lb 3.2 oz (109.9 kg)   Height: 6\' 1"  (1.854 m)   PainSc:  0-No pain   Body mass index is 31.95 kg/m.  Advanced Directives 05/18/2021 04/02/2020 04/02/2019 03/29/2018  Does Patient Have a Medical Advance Directive? Yes Yes Yes Yes  Type of Paramedic of [place];Living will [place];Living will [place];Living will [place];Living will  Does patient want to make changes to medical advance directive? - No - Patient declined No - Patient declined No - Patient declined  Copy of [place] in Chart? No - copy requested No - copy requested No - copy requested No - copy requested    Current Medications (verified) Outpatient Encounter Medications as of 05/18/2021  Medication Sig   aspirin 81 MG tablet Take 81 mg by mouth daily.     Cholecalciferol (VITAMIN D3) 1000 UNITS tablet Take 1,000 Units by mouth daily.     doxycycline (VIBRAMYCIN) 100 MG capsule Take 1 capsule (100 mg total) by mouth 2 (two) times daily.   lisinopril-hydrochlorothiazide (ZESTORETIC) 20-25 MG tablet Take 1 tablet by mouth daily.   mupirocin ointment (BACTROBAN) 2 % Apply thin film twice daily   NON FORMULARY Cherry Extract as needed for Gout   simvastatin (ZOCOR) 40 MG tablet Take 1 tablet (40 mg total) by mouth at bedtime.   cyclobenzaprine (FLEXERIL) 5 MG tablet Take 1 tablet (5 mg total) by mouth at bedtime. (Patient not taking: No sig reported)   No facility-administered encounter  medications on file as of 05/18/2021.    Allergies (verified) Patient has no known allergies.   History: 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 NO MX   Hyperlipidemia    Hypertension    Mumps    Shingles 08/05/2017   Past Surgical History:  Procedure Laterality Date   COLONOSCOPY  2007   TONSILLECTOMY     URINARY DIVERSION  2002   radical cystoprostatecomy and urinary diversion    Family History  Problem Relation Age of Onset   Coronary artery disease Father 51       Deceased   Hypertension Father    Alzheimer's disease Father    Hypertension Mother        Living   Diabetes Brother        borderline #1   Stroke Paternal Grandfather    Heart attack Maternal Grandfather    Arthritis Other    Hypertension Brother        #2   Heart disease Brother        #2   Healthy Son        x1   Social History   Socioeconomic History   Marital status: Married  Spouse name: Not on file   Number of children: Not on file   Years of education: Not on file   Highest education level: Not on file  Occupational History   Not on file  Tobacco Use   Smoking status: Never   Smokeless tobacco: Never  Substance and Sexual Activity   Alcohol use: Yes    Alcohol/week: 3.0 standard drinks    Types: 3 Cans of beer per week   Drug use: No   Sexual activity: Not Currently  Other Topics Concern   Not on file  Social History Narrative   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      [place]   Married for 19 year    one son 34    New grandchild   Alcohol use-yes (social)  < 14 drinks per week.   Social Determinants of Health   Financial Resource Strain: Low Risk    Difficulty of Paying Living Expenses: Not hard at all   Food Insecurity: No Food Insecurity   Worried About Charity fundraiser in the Last Year: Never true   [place] in the Last Year: Never true  Transportation Needs: No Transportation Needs   Lack of Transportation (Medical): No   Lack of Transportation (Non-Medical): No  Physical Activity: Sufficiently Active   Days of Exercise per Week: 7 days   Minutes of Exercise per Session: 40 min  Stress: No Stress Concern Present   Feeling of Stress : Not at all  Social Connections: Moderately Isolated   Frequency of Communication with Friends and Family: More than three times a week   Frequency of Social Gatherings with Friends and Family: More than three times a week   Attends Religious Services: Never   Marine scientist or Organizations: No   Attends Music therapist: Never   Marital Status: Married    Tobacco Counseling Counseling given: Not Answered   Clinical Intake:  Pre-visit preparation completed: Yes  Pain : 0-10 Pain Score: 0-No pain Pain Type: Chronic pain Pain Location: Back Pain Orientation: Lower Pain Onset: More than a month ago Pain Frequency: Intermittent Pain Relieving Factors: Stretching  Pain Relieving Factors: Stretching  Nutritional Status: BMI > 30  Obese Nutritional Risks: None Diabetes: No  How often do you need to have someone help you when you read instructions, pamphlets, or other written materials from your doctor or pharmacy?: 1 - Never  Diabetic?No  Interpreter Needed?: No  Information entered by :: Caroleen Hamman LPN   Activities of Daily Living In your present state of health, do you have any difficulty performing the following activities: 05/18/2021  Hearing? N  Vision? N  Difficulty concentrating or making decisions? N  Walking or climbing stairs? N  Dressing or bathing? N  Doing errands, shopping? N  Preparing Food and eating ? N  Using the Toilet? N  In the past six months, have you accidently leaked  urine? Y  Do you have problems with loss of bowel control? N  Managing your Medications? N  Managing your Finances? N  Housekeeping or managing your Housekeeping? N  Some recent data might be hidden    Patient Care Team: Saguier, Iris Pert as PCP - General (Internal Medicine) Franchot Gallo, MD as Consulting Physician (Urology) Selina Cooley, MD as Referring Physician (Dermatology)  Indicate any recent Medical Services you may have received from other than [facility] providers in the  past year (date may be approximate).     Assessment:   This is a routine wellness examination for Gregory Tate.  Hearing/Vision screen Hearing Screening - Comments:: No issues Vision Screening - Comments:: Reading glasses Last eye exam-12/2020-Dr. Luana Shu  Dietary issues and exercise activities discussed: Current Exercise Habits: Home exercise routine, Type of exercise: walking, Time (Minutes): 45, Frequency (Times/Week): 7, Weekly Exercise (Minutes/Week): 315, Intensity: Mild, Exercise limited by: None identified   Goals Addressed             This Visit's Progress    DIET - INCREASE WATER INTAKE   On track      Depression Screen PHQ 2/9 Scores 05/18/2021 04/02/2020 04/02/2019 03/29/2018 01/15/2016  PHQ - 2 Score 0 0 0 0 0    Fall Risk Fall Risk  05/18/2021 04/02/2020 04/02/2019 03/29/2018 07/03/2017  Falls in the past year? 0 1 0 No No  Comment - - - - Emmi Telephone Survey: data to providers prior to load  Number falls in past yr: 0 0 - - -  Injury with Fall? 0 1 - - -  Follow up Falls prevention discussed Education provided;Falls prevention discussed - - -    FALL RISK PREVENTION PERTAINING TO THE HOME:  Any stairs in or around the home? Yes  If so, are there any without handrails? No  Home free of loose throw rugs in walkways, pet beds, electrical cords, etc? Yes  Adequate lighting in your home to reduce risk of falls? Yes   ASSISTIVE DEVICES UTILIZED TO PREVENT FALLS:  Life alert? No   Use of a cane, walker or w/c? No  Grab bars in the bathroom? No  Shower chair or bench in shower? No  Elevated toilet seat or a handicapped toilet? No   TIMED UP AND GO:  Was the test performed? Yes .  Length of time to ambulate 10 feet: 10 sec.   Gait steady and fast without use of assistive device  Cognitive Function:Normal cognitive status assessed by direct observation by this Nurse Health Advisor. No abnormalities found.          Immunizations Immunization History  Administered Date(s) Administered   Influenza Whole 11/30/2007, 08/28/2009   Influenza-Unspecified 04/05/2015   PFIZER(Purple Top)SARS-COV-2 Vaccination 01/02/2020, 01/30/2020, 02/01/2021   Pneumococcal Conjugate-13 02/24/2015   Pneumococcal Polysaccharide-23 01/02/2017   Td 05/22/2006   Tdap 01/02/2017    TDAP status: Up to date  Flu Vaccine status: Declined, Education has been provided regarding the importance of this vaccine but patient still declined. Advised may receive this vaccine at local pharmacy or Health Dept. Aware to provide a copy of the vaccination record if obtained from local pharmacy or Health Dept. Verbalized acceptance and understanding.  Pneumococcal vaccine status: Up to date  Covid-19 vaccine status: Completed vaccines  Qualifies for Shingles Vaccine? Yes   Zostavax completed No   Shingrix Completed?: No.    Education has been provided regarding the importance of this vaccine. Patient has been advised to call insurance company to determine out of pocket expense if they have not yet received this vaccine. Advised may also receive vaccine at local pharmacy or Health Dept. Verbalized acceptance and understanding.  Screening Tests Health Maintenance  Topic Date Due   Zoster Vaccines- Shingrix (1 of 2) Never done   COVID-19 Vaccine (3 - Booster for Pfizer series) 06/28/2020   INFLUENZA VACCINE  07/05/2021   TETANUS/TDAP  01/02/2027   COLONOSCOPY (Pts 45-17yrs Insurance coverage will  need to be confirmed)  01/17/2027  Hepatitis C Screening  Completed   PNA vac Low Risk Adult  Completed   HPV VACCINES  Aged Out    Health Maintenance  Health Maintenance Due  Topic Date Due   Zoster Vaccines- Shingrix (1 of 2) Never done   COVID-19 Vaccine (3 - Booster for Pfizer series) 06/28/2020    Colorectal cancer screening: Type of screening: Colonoscopy. Completed 01/17/2017. Repeat every 10 years  Lung Cancer Screening: (Low Dose CT Chest recommended if Age 68-80 years, 30 pack-year currently smoking OR have quit w/in 15years.) does not qualify.     Additional Screening:  Hepatitis C Screening: Completed 01/02/2017  Vision Screening: Recommended annual ophthalmology exams for early detection of glaucoma and other disorders of the eye. Is the patient up to date with their annual eye exam?  Yes  Who is the provider or what is the name of the office in which the patient attends annual eye exams? Dr. Luana Shu   Dental Screening: Recommended annual dental exams for proper oral hygiene  Community Resource Referral / Chronic Care Management: CRR required this visit?  No   CCM required this visit?  No      Plan:     I have personally reviewed and noted the following in the patient's chart:   Medical and social history Use of alcohol, tobacco or illicit drugs  Current medications and supplements including opioid prescriptions. Patient is not currently taking opioid prescriptions. Functional ability and status Nutritional status Physical activity Advanced directives List of other physicians Hospitalizations, surgeries, and ER visits in previous 12 months Vitals Screenings to include cognitive, depression, and falls Referrals and appointments  In addition, I have reviewed and discussed with patient certain preventive protocols, quality metrics, and best practice recommendations. A written personalized care plan for preventive services as well as general preventive  health recommendations were provided to patient.   Patient would like to access avs on mychart   Marta Antu, [place]   8/59/2924  Nurse Health Advisor  Nurse Notes: None  Agree with plan and assesment of LPN.  Mackie Pai, PA-C

## 2021-05-18 ENCOUNTER — Ambulatory Visit (INDEPENDENT_AMBULATORY_CARE_PROVIDER_SITE_OTHER): Payer: Medicare Other

## 2021-05-18 ENCOUNTER — Other Ambulatory Visit: Payer: Self-pay

## 2021-05-18 ENCOUNTER — Telehealth: Payer: Self-pay

## 2021-05-18 VITALS — BP 136/84 | HR 68 | Temp 98.1°F | Resp 16 | Ht 73.0 in | Wt 242.2 lb

## 2021-05-18 DIAGNOSIS — Z0000 Encounter for general adult medical examination without abnormal findings: Secondary | ICD-10-CM

## 2021-05-18 NOTE — Patient Instructions (Signed)
Gregory Tate , Thank you for taking time to come for your Medicare Wellness Visit. I appreciate your ongoing commitment to your health goals. Please review the following plan we discussed and let me know if I can assist you in the future.   Screening recommendations/referrals: Colonoscopy: No longer required Recommended yearly ophthalmology/optometry visit for glaucoma screening and checkup Recommended yearly dental visit for hygiene and checkup  Vaccinations: Influenza vaccine: Declined Pneumococcal vaccine: Up to date Tdap vaccine: Up to date-Due-01/02/2027 Shingles vaccine: Discuss with pharmacy   Covid-19: Up to date  Advanced directives: Please bring a copy for your chart  Conditions/risks identified: See problem list  Next appointment: Follow up in one year for your annual wellness visit. 05/25/2022 @ 10:20  Preventive Care 65 Years and Older, Male Preventive care refers to lifestyle choices and visits with your health care provider that can promote health and wellness. What does preventive care include? A yearly physical exam. This is also called an annual well check. Dental exams once or twice a year. Routine eye exams. Ask your health care provider how often you should have your eyes checked. Personal lifestyle choices, including: Daily care of your teeth and gums. Regular physical activity. Eating a healthy diet. Avoiding tobacco and drug use. Limiting alcohol use. Practicing safe sex. Taking low doses of aspirin every day. Taking vitamin and mineral supplements as recommended by your health care provider. What happens during an annual well check? The services and screenings done by your health care provider during your annual well check will depend on your age, overall health, lifestyle risk factors, and family history of disease. Counseling  Your health care provider may ask you questions about your: Alcohol use. Tobacco use. Drug use. Emotional well-being. Home  and relationship well-being. Sexual activity. Eating habits. History of falls. Memory and ability to understand (cognition). Work and work Statistician. Screening  You may have the following tests or measurements: Height, weight, and BMI. Blood pressure. Lipid and cholesterol levels. These may be checked every 5 years, or more frequently if you are over 34 years old. Skin check. Lung cancer screening. You may have this screening every year starting at age 41 if you have a 30-pack-year history of smoking and currently smoke or have quit within the past 15 years. Fecal occult blood test (FOBT) of the stool. You may have this test every year starting at age 69. Flexible sigmoidoscopy or colonoscopy. You may have a sigmoidoscopy every 5 years or a colonoscopy every 10 years starting at age 6. Prostate cancer screening. Recommendations will vary depending on your family history and other risks. Hepatitis C blood test. Hepatitis B blood test. Sexually transmitted disease (STD) testing. Diabetes screening. This is done by checking your blood sugar (glucose) after you have not eaten for a while (fasting). You may have this done every 1-3 years. Abdominal aortic aneurysm (AAA) screening. You may need this if you are a current or former smoker. Osteoporosis. You may be screened starting at age 63 if you are at high risk. Talk with your health care provider about your test results, treatment options, and if necessary, the need for more tests. Vaccines  Your health care provider may recommend certain vaccines, such as: Influenza vaccine. This is recommended every year. Tetanus, diphtheria, and acellular pertussis (Tdap, Td) vaccine. You may need a Td booster every 10 years. Zoster vaccine. You may need this after age 29. Pneumococcal 13-valent conjugate (PCV13) vaccine. One dose is recommended after age 7. Pneumococcal polysaccharide (PPSV23)  vaccine. One dose is recommended after age 62. Talk to  your health care provider about which screenings and vaccines you need and how often you need them. This information is not intended to replace advice given to you by your health care provider. Make sure you discuss any questions you have with your health care provider. Document Released: 12/18/2015 Document Revised: 08/10/2016 Document Reviewed: 09/22/2015 Elsevier Interactive Patient Education  2017 [place] Prevention in the Home Falls can cause injuries. They can happen to people of all ages. There are many things you can do to make your home safe and to help prevent falls. What can I do on the outside of my home? Regularly fix the edges of walkways and driveways and fix any cracks. Remove anything that might make you trip as you walk through a door, such as a raised step or threshold. Trim any bushes or trees on the path to your home. Use bright outdoor lighting. Clear any walking paths of anything that might make someone trip, such as rocks or tools. Regularly check to see if handrails are loose or broken. Make sure that both sides of any steps have handrails. Any raised decks and porches should have guardrails on the edges. Have any leaves, snow, or ice cleared regularly. Use sand or salt on walking paths during winter. Clean up any spills in your garage right away. This includes oil or grease spills. What can I do in the bathroom? Use night lights. Install grab bars by the toilet and in the tub and shower. Do not use towel bars as grab bars. Use non-skid mats or decals in the tub or shower. If you need to sit down in the shower, use a plastic, non-slip stool. Keep the floor dry. Clean up any water that spills on the floor as soon as it happens. Remove soap buildup in the tub or shower regularly. Attach bath mats securely with double-sided non-slip rug tape. Do not have throw rugs and other things on the floor that can make you trip. What can I do in the bedroom? Use  night lights. Make sure that you have a light by your bed that is easy to reach. Do not use any sheets or blankets that are too big for your bed. They should not hang down onto the floor. Have a firm chair that has side arms. You can use this for support while you get dressed. Do not have throw rugs and other things on the floor that can make you trip. What can I do in the kitchen? Clean up any spills right away. Avoid walking on wet floors. Keep items that you use a lot in easy-to-reach places. If you need to reach something above you, use a strong step stool that has a grab bar. Keep electrical cords out of the way. Do not use floor polish or wax that makes floors slippery. If you must use wax, use non-skid floor wax. Do not have throw rugs and other things on the floor that can make you trip. What can I do with my stairs? Do not leave any items on the stairs. Make sure that there are handrails on both sides of the stairs and use them. Fix handrails that are broken or loose. Make sure that handrails are as long as the stairways. Check any carpeting to make sure that it is firmly attached to the stairs. Fix any carpet that is loose or worn. Avoid having throw rugs at the top or bottom  of the stairs. If you do have throw rugs, attach them to the floor with carpet tape. Make sure that you have a light switch at the top of the stairs and the bottom of the stairs. If you do not have them, ask someone to add them for you. What else can I do to help prevent falls? Wear shoes that: Do not have high heels. Have rubber bottoms. Are comfortable and fit you well. Are closed at the toe. Do not wear sandals. If you use a stepladder: Make sure that it is fully opened. Do not climb a closed stepladder. Make sure that both sides of the stepladder are locked into place. Ask someone to hold it for you, if possible. Clearly mark and make sure that you can see: Any grab bars or handrails. First and last  steps. Where the edge of each step is. Use tools that help you move around (mobility aids) if they are needed. These include: Canes. Walkers. Scooters. Crutches. Turn on the lights when you go into a dark area. Replace any light bulbs as soon as they burn out. Set up your furniture so you have a clear path. Avoid moving your furniture around. If any of your floors are uneven, fix them. If there are any pets around you, be aware of where they are. Review your medicines with your doctor. Some medicines can make you feel dizzy. This can increase your chance of falling. Ask your doctor what other things that you can do to help prevent falls. This information is not intended to replace advice given to you by your health care provider. Make sure you discuss any questions you have with your health care provider. Document Released: 09/17/2009 Document Revised: 04/28/2016 Document Reviewed: 12/26/2014 Elsevier Interactive Patient Education  2017 [employer].

## 2021-05-18 NOTE — Telephone Encounter (Signed)
Patient is requesting refills of Simvastatin & Lisinopril-hctz. Wants the scripts sent to mail order pharmacy. I have informed him that he may need an office visit soon.

## 2021-05-19 DIAGNOSIS — M5386 Other specified dorsopathies, lumbar region: Secondary | ICD-10-CM | POA: Diagnosis not present

## 2021-05-19 DIAGNOSIS — M9902 Segmental and somatic dysfunction of thoracic region: Secondary | ICD-10-CM | POA: Diagnosis not present

## 2021-05-19 DIAGNOSIS — M9903 Segmental and somatic dysfunction of lumbar region: Secondary | ICD-10-CM | POA: Diagnosis not present

## 2021-05-19 DIAGNOSIS — M9901 Segmental and somatic dysfunction of cervical region: Secondary | ICD-10-CM | POA: Diagnosis not present

## 2021-05-19 DIAGNOSIS — M9904 Segmental and somatic dysfunction of sacral region: Secondary | ICD-10-CM | POA: Diagnosis not present

## 2021-05-19 NOTE — Telephone Encounter (Signed)
Appt scheduled

## 2021-05-20 ENCOUNTER — Ambulatory Visit (INDEPENDENT_AMBULATORY_CARE_PROVIDER_SITE_OTHER): Payer: Medicare Other | Admitting: Medical

## 2021-05-20 ENCOUNTER — Encounter: Payer: Self-pay | Admitting: Medical

## 2021-05-20 ENCOUNTER — Other Ambulatory Visit: Payer: Self-pay

## 2021-05-20 VITALS — BP 139/78 | HR 68 | Resp 18 | Ht 73.0 in | Wt 242.0 lb

## 2021-05-20 DIAGNOSIS — I10 Essential (primary) hypertension: Secondary | ICD-10-CM | POA: Diagnosis not present

## 2021-05-20 DIAGNOSIS — R739 Hyperglycemia, unspecified: Secondary | ICD-10-CM | POA: Diagnosis not present

## 2021-05-20 DIAGNOSIS — E785 Hyperlipidemia, unspecified: Secondary | ICD-10-CM

## 2021-05-20 LAB — LIPID PANEL
Cholesterol: 163 mg/dL (ref 0–200)
HDL: 56.2 mg/dL (ref >39.00)
LDL Cholesterol: 81 mg/dL (ref 0–99)
NonHDL: 106.73
Total CHOL/HDL Ratio: 3
Triglycerides: 129 mg/dL (ref 0.0–149.0)
VLDL: 25.8 mg/dL (ref 0.0–40.0)

## 2021-05-20 LAB — COMPREHENSIVE METABOLIC PANEL
ALT: 22 U/L (ref 0–53)
AST: 18 U/L (ref 0–37)
Albumin: 4.3 g/dL (ref 3.5–5.2)
Alkaline Phosphatase: 41 U/L (ref 39–117)
BUN: 23 mg/dL (ref 6–23)
CO2: 30 mEq/L (ref 19–32)
Calcium: 9.1 mg/dL (ref 8.4–10.5)
Chloride: 100 mEq/L (ref 96–112)
Creatinine, Ser: 0.91 mg/dL (ref 0.40–1.50)
GFR: 84.89 mL/min (ref >60.00)
Glucose, Bld: 111 mg/dL — ABNORMAL HIGH (ref 70–99)
Potassium: 4.8 mEq/L (ref 3.5–5.1)
Sodium: 136 mEq/L (ref 135–145)
Total Bilirubin: 0.7 mg/dL (ref 0.2–1.2)
Total Protein: 6.6 g/dL (ref 6.0–8.3)

## 2021-05-20 LAB — HEMOGLOBIN A1C: Hgb A1c MFr Bld: 6.2 % (ref 4.6–6.5)

## 2021-05-20 NOTE — Patient Instructions (Addendum)
Your blood pressure is better today on recheck. Continue zestoretic 20-25 1 tab daily.  For high cholesterol continue simvastatin. May need does adjustment if needed.  For elevated sugar eat low sugar diet and get a1c.   Get cmp and lipid panel today as well.  Follow up date 3-6 months depending on lab results.

## 2021-05-20 NOTE — Progress Notes (Signed)
Subjective:    Patient ID: Gregory Tate, male    DOB: 23-May-1951, 70 y.o.   MRN: [id number]  HPI  Pt in for follow up.  Pt has htn. BP was 622 systolic on Tuesday. Pt is on zestoretic.   Pt has history of high cholesterol. Pt is on simvastatin.   Pt has hx of elevate sugar. He is eating low sugar diet. He is trying to walk 4-5 miles a day.  Mild high triglycerides. He is on simvastatin.     Review of Systems  Constitutional:  Negative for chills, fatigue and fever.  HENT:  Negative for congestion, drooling and ear pain.   Respiratory:  Negative for cough, chest tightness, shortness of breath and wheezing.   Cardiovascular:  Negative for chest pain and palpitations.  Gastrointestinal:  Negative for abdominal pain.  Endocrine: Negative for polydipsia and polyuria.  Musculoskeletal:  Negative for back pain.  Skin:  Negative for rash.     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 NO MX   Hyperlipidemia    Hypertension    Mumps    Shingles 08/05/2017     Social History   Socioeconomic History   Marital status: Married    Spouse name: Not on file   Number of children: Not on file   Years of education: Not on file   Highest education level: Not on file  Occupational History   Not on file  Tobacco Use   Smoking status: Never   Smokeless tobacco: Never  Substance and Sexual Activity   Alcohol use: Yes    Alcohol/week: 3.0 standard drinks    Types: 3 Cans of beer per week   Drug use: No   Sexual activity: Not Currently  Other Topics Concern   Not on file  Social History Narrative   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      [employer]   Married for 73 year    one son 24    New grandchild   Alcohol  use-yes (social)  < 14 drinks per week.   Social Determinants of Health   Financial Resource Strain: Low Risk    Difficulty of Paying Living Expenses: Not hard at all  Food Insecurity: No Food Insecurity   Worried About Charity fundraiser in the Last Year: Never true   [place] in the Last Year: Never true  Transportation Needs: No Transportation Needs   Lack of Transportation (Medical): No   Lack of Transportation (Non-Medical): No  Physical Activity: Sufficiently Active   Days of Exercise per Week: 7 days   Minutes of Exercise per Session: 40 min  Stress: No Stress Concern Present   Feeling of Stress : Not at all  Social Connections: Moderately Isolated   Frequency of Communication with Friends and Family: More than three times a week   Frequency of Social Gatherings with Friends and Family: More than three times a week   Attends Religious Services: Never   Marine scientist or Organizations: No   Attends Archivist Meetings: Never   Marital Status: Married  Human resources officer Violence: Not At Risk   Fear of Current or  Ex-Partner: No   Emotionally Abused: No   Physically Abused: No   Sexually Abused: No    Past Surgical History:  Procedure Laterality Date   COLONOSCOPY  2007   TONSILLECTOMY     URINARY DIVERSION  2002   radical cystoprostatecomy and urinary diversion     Family History  Problem Relation Age of Onset   Coronary artery disease Father 40       Deceased   Hypertension Father    Alzheimer's disease Father    Hypertension Mother        Living   Diabetes Brother        borderline #1   Stroke Paternal Grandfather    Heart attack Maternal Grandfather    Arthritis Other    Hypertension Brother        #2   Heart disease Brother        #2   Healthy Son        x1    No Known Allergies  Current Outpatient Medications on File Prior to Visit  Medication Sig Dispense Refill   aspirin 81 MG tablet Take 81 mg by mouth daily.        Cholecalciferol (VITAMIN D3) 1000 UNITS tablet Take 1,000 Units by mouth daily.       lisinopril-hydrochlorothiazide (ZESTORETIC) 20-25 MG tablet Take 1 tablet by mouth daily. 90 tablet 3   mupirocin ointment (BACTROBAN) 2 % Apply thin film twice daily 22 g 0   NON FORMULARY Cherry Extract as needed for Gout     simvastatin (ZOCOR) 40 MG tablet Take 1 tablet (40 mg total) by mouth at bedtime. 90 tablet 3   cyclobenzaprine (FLEXERIL) 5 MG tablet Take 1 tablet (5 mg total) by mouth at bedtime. (Patient not taking: No sig reported) 7 tablet 1   doxycycline (VIBRAMYCIN) 100 MG capsule Take 1 capsule (100 mg total) by mouth 2 (two) times daily. 20 capsule 0   No current facility-administered medications on file prior to visit.    BP 139/78   Pulse 68   Resp 18   Ht 6\' 1"  (1.854 m)   Wt 242 lb (109.8 kg)   SpO2 99%   BMI 31.93 kg/m       Objective:   Physical Exam   General- No acute distress. Pleasant patient. Neck- Full range of motion, no jvd Lungs- Clear, even and unlabored. Heart- regular rate and rhythm. Neurologic- CNII- XII grossly intact.      Assessment & Plan:  Your blood pressure is better today on recheck. Continue zestoretic 20-25 1 tab daily.  For high cholesterol continue simvastatin. May need does adjustment if needed.  For elevated sugar eat low sugar diet and get a1c.   Get cmp and lipid panel today as well.  Follow up date 3-6 months depending on lab results.   Mackie Pai, PA-C

## 2021-05-21 MED ORDER — SIMVASTATIN 40 MG PO TABS: 40.0000 mg | ORAL_TABLET | Freq: Every day | ORAL | 3 refills | Status: DC

## 2021-05-21 MED ORDER — LISINOPRIL-HYDROCHLOROTHIAZIDE 20-25 MG PO TABS: 1.0000 | ORAL_TABLET | Freq: Every day | ORAL | 3 refills | Status: DC

## 2021-05-21 NOTE — Telephone Encounter (Signed)
Spoke with company states the will be faxing over a form to retrieve medical records

## 2021-06-16 DIAGNOSIS — M9904 Segmental and somatic dysfunction of sacral region: Secondary | ICD-10-CM | POA: Diagnosis not present

## 2021-06-16 DIAGNOSIS — M9901 Segmental and somatic dysfunction of cervical region: Secondary | ICD-10-CM | POA: Diagnosis not present

## 2021-06-16 DIAGNOSIS — M5386 Other specified dorsopathies, lumbar region: Secondary | ICD-10-CM | POA: Diagnosis not present

## 2021-06-16 DIAGNOSIS — M9902 Segmental and somatic dysfunction of thoracic region: Secondary | ICD-10-CM | POA: Diagnosis not present

## 2021-06-16 DIAGNOSIS — M9903 Segmental and somatic dysfunction of lumbar region: Secondary | ICD-10-CM | POA: Diagnosis not present

## 2021-06-18 ENCOUNTER — Telehealth: Payer: Medicare Other | Admitting: Physician Assistant

## 2021-06-18 ENCOUNTER — Encounter: Payer: Self-pay | Admitting: Physician Assistant

## 2021-06-18 DIAGNOSIS — B9689 Other specified bacterial agents as the cause of diseases classified elsewhere: Secondary | ICD-10-CM | POA: Diagnosis not present

## 2021-06-18 DIAGNOSIS — J0140 Acute pansinusitis, unspecified: Secondary | ICD-10-CM

## 2021-06-18 DIAGNOSIS — J208 Acute bronchitis due to other specified organisms: Secondary | ICD-10-CM | POA: Diagnosis not present

## 2021-06-18 MED ORDER — PREDNISONE 10 MG (21) PO TBPK
ORAL_TABLET | ORAL | 0 refills | Status: DC
Start: 2021-06-18 — End: 2022-05-24

## 2021-06-18 MED ORDER — DOXYCYCLINE HYCLATE 100 MG PO CAPS: 100.0000 mg | ORAL_CAPSULE | Freq: Two times a day (BID) | ORAL | 0 refills | Status: DC

## 2021-06-18 MED ORDER — BENZONATATE 100 MG PO CAPS: 100.0000 mg | ORAL_CAPSULE | Freq: Three times a day (TID) | ORAL | 0 refills | Status: DC | PRN

## 2021-06-18 MED ORDER — ALBUTEROL SULFATE HFA 108 (90 BASE) MCG/ACT IN AERS: 2.0000 | INHALATION_SPRAY | Freq: Four times a day (QID) | RESPIRATORY_TRACT | 0 refills | Status: DC | PRN

## 2021-06-18 NOTE — Progress Notes (Signed)
Gregory Tate, Gregory Tate are scheduled for a virtual visit with your provider today.    Just as we do with appointments in the office, we must obtain your consent to participate.  Your consent will be active for this visit and any virtual visit you may have with one of our providers in the next 365 days.    If you have a MyChart account, I can also send a copy of this consent to you electronically.  All virtual visits are billed to your insurance company just like a traditional visit in the office.  As this is a virtual visit, video technology does not allow for your provider to perform a traditional examination.  This may limit your provider's ability to fully assess your condition.  If your provider identifies any concerns that need to be evaluated in person or the need to arrange testing such as labs, EKG, etc, we will make arrangements to do so.    Although advances in technology are sophisticated, we cannot ensure that it will always work on either your end or our end.  If the connection with a video visit is poor, we may have to switch to a telephone visit.  With either a video or telephone visit, we are not always able to ensure that we have a secure connection.   I need to obtain your verbal consent now.   Are you willing to proceed with your visit today?   Gregory Tate has provided verbal consent on 06/18/2021 for a virtual visit (video or telephone).   Mar Daring, PA-C 06/18/2021  10:05 AM  Virtual Visit Consent   Gregory Tate, you are scheduled for a virtual visit with a [facility] provider today.     Just as with appointments in the office, your consent must be obtained to participate.  Your consent will be active for this visit and any virtual visit you may have with one of our providers in the next 365 days.     If you have a MyChart account, a copy of this consent can be sent to you electronically.  All virtual visits are billed to your insurance company just like a traditional  visit in the office.    As this is a virtual visit, video technology does not allow for your provider to perform a traditional examination.  This may limit your provider's ability to fully assess your condition.  If your provider identifies any concerns that need to be evaluated in person or the need to arrange testing (such as labs, EKG, etc.), we will make arrangements to do so.     Although advances in technology are sophisticated, we cannot ensure that it will always work on either your end or our end.  If the connection with a video visit is poor, the visit may have to be switched to a telephone visit.  With either a video or telephone visit, we are not always able to ensure that we have a secure connection.     I need to obtain your verbal consent now.   Are you willing to proceed with your visit today?    Gregory Tate has provided verbal consent on 06/18/2021 for a virtual visit (video or telephone).   Mar Daring, PA-C   Date: 06/18/2021 10:05 AM   Virtual Visit via Video Note   I, Mar Daring, connected with  Gregory Tate  ([id number], August 09, 1951) on 06/18/21 at 10:00 AM EDT by a video-enabled telemedicine application and  verified that I am speaking with the correct person using two identifiers.  Location: Patient: Virtual Visit Location Patient: Home Provider: Virtual Visit Location Provider: Home Office   I discussed the limitations of evaluation and management by telemedicine and the availability of in person appointments. The patient expressed understanding and agreed to proceed.    History of Present Illness: Gregory Tate is a 70 y.o. who identifies as a male who was assigned male at birth, and is being seen today for upper respiratory symptoms.  HPI: URI  This is a new problem. The current episode started in the past 7 days (3 days ago). The problem has been gradually worsening. There has been no fever. Associated symptoms include congestion, coughing,  diarrhea (2 nights ago, now improved), headaches, rhinorrhea, sinus pain, a sore throat (feels from coughing up mucous) and wheezing. Pertinent negatives include no ear pain, nausea, plugged ear sensation, swollen glands or vomiting.    2 home covid test have been negative   Problems:  Patient Active Problem List   Diagnosis Date Noted   Encounter for screening colonoscopy 01/30/2017   Need for shingles vaccine 01/15/2016   Incontinence 02/20/2014   Dermatitis 01/28/2013   Leg swelling 05/24/2012   Knee pain 05/24/2012   Myalgia 03/25/2011   HYPERGLYCEMIA 01/24/2011   ERECTILE DYSFUNCTION, ORGANIC 08/28/2009   NEOPLASM, MALIGNANT, BLADDER, HX OF 07/18/2008   Hyperlipidemia 11/30/2007   Essential hypertension 11/30/2007   BACK PAIN 07/04/2007    Allergies: No Known Allergies Medications:  Current Outpatient Medications:    albuterol (VENTOLIN HFA) 108 (90 Base) MCG/ACT inhaler, Inhale 2 puffs into the lungs every 6 (six) hours as needed for wheezing or shortness of breath., Disp: 8 g, Rfl: 0   benzonatate (TESSALON) 100 MG capsule, Take 1 capsule (100 mg total) by mouth 3 (three) times daily as needed., Disp: 30 capsule, Rfl: 0   predniSONE (STERAPRED UNI-PAK 21 TAB) 10 MG (21) TBPK tablet, 6 day taper; take as directed on package instructions, Disp: 21 tablet, Rfl: 0   aspirin 81 MG tablet, Take 81 mg by mouth daily.  , Disp: , Rfl:    Cholecalciferol (VITAMIN D3) 1000 UNITS tablet, Take 1,000 Units by mouth daily.  , Disp: , Rfl:    cyclobenzaprine (FLEXERIL) 5 MG tablet, Take 1 tablet (5 mg total) by mouth at bedtime. (Patient not taking: No sig reported), Disp: 7 tablet, Rfl: 1   doxycycline (VIBRAMYCIN) 100 MG capsule, Take 1 capsule (100 mg total) by mouth 2 (two) times daily., Disp: 20 capsule, Rfl: 0   lisinopril-hydrochlorothiazide (ZESTORETIC) 20-25 MG tablet, Take 1 tablet by mouth daily., Disp: 90 tablet, Rfl: 3   mupirocin ointment (BACTROBAN) 2 %, Apply thin film twice  daily, Disp: 22 g, Rfl: 0   NON FORMULARY, Cherry Extract as needed for Gout, Disp: , Rfl:    simvastatin (ZOCOR) 40 MG tablet, Take 1 tablet (40 mg total) by mouth at bedtime., Disp: 90 tablet, Rfl: 3  Observations/Objective: Patient is well-developed, well-nourished in no acute distress.  Resting comfortably at home.  Head is normocephalic, atraumatic.  No labored breathing. Speech is clear and coherent with logical content.  Patient is alert and oriented at baseline.    Assessment and Plan: 1. Acute bacterial bronchitis - doxycycline (VIBRAMYCIN) 100 MG capsule; Take 1 capsule (100 mg total) by mouth 2 (two) times daily.  Dispense: 20 capsule; Refill: 0 - albuterol (VENTOLIN HFA) 108 (90 Base) MCG/ACT inhaler; Inhale 2 puffs into the lungs  every 6 (six) hours as needed for wheezing or shortness of breath.  Dispense: 8 g; Refill: 0 - predniSONE (STERAPRED UNI-PAK 21 TAB) 10 MG (21) TBPK tablet; 6 day taper; take as directed on package instructions  Dispense: 21 tablet; Refill: 0 - benzonatate (TESSALON) 100 MG capsule; Take 1 capsule (100 mg total) by mouth 3 (three) times daily as needed.  Dispense: 30 capsule; Refill: 0  2. Acute non-recurrent pansinusitis - doxycycline (VIBRAMYCIN) 100 MG capsule; Take 1 capsule (100 mg total) by mouth 2 (two) times daily.  Dispense: 20 capsule; Refill: 0 - albuterol (VENTOLIN HFA) 108 (90 Base) MCG/ACT inhaler; Inhale 2 puffs into the lungs every 6 (six) hours as needed for wheezing or shortness of breath.  Dispense: 8 g; Refill: 0 - predniSONE (STERAPRED UNI-PAK 21 TAB) 10 MG (21) TBPK tablet; 6 day taper; take as directed on package instructions  Dispense: 21 tablet; Refill: 0 - benzonatate (TESSALON) 100 MG capsule; Take 1 capsule (100 mg total) by mouth 3 (three) times daily as needed.  Dispense: 30 capsule; Refill: 0  - Worsening symptoms that have not responded to OTC medications.  - Will give doxycycline, prednisone, albuterol and tessalon  perles as noted above  - Continue allergy medications.  - Stay well hydrated and get plenty of rest.  - Seek in person evaluation if no symptom improvement or if symptoms worsen.   Follow Up Instructions: I discussed the assessment and treatment plan with the patient. The patient was provided an opportunity to ask questions and all were answered. The patient agreed with the plan and demonstrated an understanding of the instructions.  A copy of instructions were sent to the patient via MyChart.  The patient was advised to call back or seek an in-person evaluation if the symptoms worsen or if the condition fails to improve as anticipated.  Time:  I spent 12 minutes with the patient via telehealth technology discussing the above problems/concerns.    Mar Daring, PA-C

## 2021-06-18 NOTE — Patient Instructions (Signed)
Gregory Tate, thank you for joining Mar Daring, PA-C for today's virtual visit.  While this provider is not your primary care provider (PCP), if your PCP is located in our provider database this encounter information will be shared with them immediately following your visit.  Consent: (Patient) Gregory Tate provided verbal consent for this virtual visit at the beginning of the encounter.  Current Medications:  Current Outpatient Medications:    albuterol (VENTOLIN HFA) 108 (90 Base) MCG/ACT inhaler, Inhale 2 puffs into the lungs every 6 (six) hours as needed for wheezing or shortness of breath., Disp: 8 g, Rfl: 0   benzonatate (TESSALON) 100 MG capsule, Take 1 capsule (100 mg total) by mouth 3 (three) times daily as needed., Disp: 30 capsule, Rfl: 0   predniSONE (STERAPRED UNI-PAK 21 TAB) 10 MG (21) TBPK tablet, 6 day taper; take as directed on package instructions, Disp: 21 tablet, Rfl: 0   aspirin 81 MG tablet, Take 81 mg by mouth daily.  , Disp: , Rfl:    Cholecalciferol (VITAMIN D3) 1000 UNITS tablet, Take 1,000 Units by mouth daily.  , Disp: , Rfl:    cyclobenzaprine (FLEXERIL) 5 MG tablet, Take 1 tablet (5 mg total) by mouth at bedtime. (Patient not taking: No sig reported), Disp: 7 tablet, Rfl: 1   doxycycline (VIBRAMYCIN) 100 MG capsule, Take 1 capsule (100 mg total) by mouth 2 (two) times daily., Disp: 20 capsule, Rfl: 0   lisinopril-hydrochlorothiazide (ZESTORETIC) 20-25 MG tablet, Take 1 tablet by mouth daily., Disp: 90 tablet, Rfl: 3   mupirocin ointment (BACTROBAN) 2 %, Apply thin film twice daily, Disp: 22 g, Rfl: 0   NON FORMULARY, Cherry Extract as needed for Gout, Disp: , Rfl:    simvastatin (ZOCOR) 40 MG tablet, Take 1 tablet (40 mg total) by mouth at bedtime., Disp: 90 tablet, Rfl: 3   Medications ordered in this encounter:  Meds ordered this encounter  Medications   doxycycline (VIBRAMYCIN) 100 MG capsule    Sig: Take 1 capsule (100 mg total) by mouth 2  (two) times daily.    Dispense:  20 capsule    Refill:  0    Order Specific Question:   Supervising Provider    Answer:   MILLER, BRIAN [[id number]]   albuterol (VENTOLIN HFA) 108 (90 Base) MCG/ACT inhaler    Sig: Inhale 2 puffs into the lungs every 6 (six) hours as needed for wheezing or shortness of breath.    Dispense:  8 g    Refill:  0    Order Specific Question:   Supervising Provider    Answer:   MILLER, BRIAN [[id number]]   predniSONE (STERAPRED UNI-PAK 21 TAB) 10 MG (21) TBPK tablet    Sig: 6 day taper; take as directed on package instructions    Dispense:  21 tablet    Refill:  0    Order Specific Question:   Supervising Provider    Answer:   MILLER, BRIAN [[id number]]   benzonatate (TESSALON) 100 MG capsule    Sig: Take 1 capsule (100 mg total) by mouth 3 (three) times daily as needed.    Dispense:  30 capsule    Refill:  0    Order Specific Question:   Supervising Provider    Answer:   Sabra Heck, [place]     *If you need refills on other medications prior to your next appointment, please contact your pharmacy*  Follow-Up: Call back or seek an in-person evaluation if the symptoms  worsen or if the condition fails to improve as anticipated.  If you have been instructed to have an in-person evaluation today at a local Urgent Care facility, please use the link below. It will take you to a list of all of our available [facility], including address, phone number and hours of operation. Please do not delay care.  [facility]  If you or a family member do not have a primary care provider, use the link below to schedule a visit and establish care. When you choose a [facility] primary care physician or advanced practice provider, you gain a long-term partner in health. Find a Primary Care Provider  Learn more about [facility]'s in-office and virtual care options: [facility]   Sinusitis, Adult Sinusitis is soreness and swelling (inflammation) of  your sinuses. Sinuses are hollow spaces in the bones around your face. They are located: Around your eyes. In the middle of your forehead. Behind your nose. In your cheekbones. Your sinuses and nasal passages are lined with a fluid called mucus. Mucus drains out of your sinuses. Swelling can trap mucus in your sinuses. This lets germs (bacteria, virus, or fungus) grow, which leads to infection. Most of the time, this condition is caused bya virus. What are the causes? This condition is caused by: Allergies. Asthma. Germs. Things that block your nose or sinuses. Growths in the nose (nasal polyps). Chemicals or irritants in the air. Fungus (rare). What increases the risk? You are more likely to develop this condition if: You have a weak body defense system (immune system). You do a lot of swimming or diving. You use nasal sprays too much. You smoke. What are the signs or symptoms? The main symptoms of this condition are pain and a feeling of pressure around the sinuses. Other symptoms include: Stuffy nose (congestion). Runny nose (drainage). Swelling and warmth in the sinuses. Headache. Toothache. A cough that may get worse at night. Mucus that collects in the throat or the back of the nose (postnasal drip). Being unable to smell and taste. Being very tired (fatigue). A fever. Sore throat. Bad breath. How is this diagnosed? This condition is diagnosed based on: Your symptoms. Your medical history. A physical exam. Tests to find out if your condition is short-term (acute) or long-term (chronic). Your doctor may: Check your nose for growths (polyps). Check your sinuses using a tool that has a light (endoscope). Check for allergies or germs. Do imaging tests, such as an MRI or CT scan. How is this treated? Treatment for this condition depends on the cause and whether it is short-term or long-term. If caused by a virus, your symptoms should go away on their own within 10  days. You may be given medicines to relieve symptoms. They include: Medicines that shrink swollen tissue in the nose. Medicines that treat allergies (antihistamines). A spray that treats swelling of the nostrils.  Rinses that help get rid of thick mucus in your nose (nasal saline washes). If caused by bacteria, your doctor may wait to see if you will get better without treatment. You may be given antibiotic medicine if you have: A very bad infection. A weak body defense system. If caused by growths in the nose, you may need to have surgery. Follow these instructions at home: Medicines Take, use, or apply over-the-counter and prescription medicines only as told by your doctor. These may include nasal sprays. If you were prescribed an antibiotic medicine, take  it as told by your doctor. Do not stop taking the antibiotic even if you start to feel better. Hydrate and humidify  Drink enough water to keep your pee (urine) pale yellow. Use a cool mist humidifier to keep the humidity level in your home above 50%. Breathe in steam for 10-15 minutes, 3-4 times a day, or as told by your doctor. You can do this in the bathroom while a hot shower is running. Try not to spend time in cool or dry air.  Rest Rest as much as you can. Sleep with your head raised (elevated). Make sure you get enough sleep each night. General instructions  Put a warm, moist washcloth on your face 3-4 times a day, or as often as told by your doctor. This will help with discomfort. Wash your hands often with soap and water. If there is no soap and water, use hand sanitizer. Do not smoke. Avoid being around people who are smoking (secondhand smoke). Keep all follow-up visits as told by your doctor. This is important.  Contact a doctor if: You have a fever. Your symptoms get worse. Your symptoms do not get better within 10 days. Get help right away if: You have a very bad headache. You cannot stop throwing up  (vomiting). You have very bad pain or swelling around your face or eyes. You have trouble seeing. You feel confused. Your neck is stiff. You have trouble breathing. Summary Sinusitis is swelling of your sinuses. Sinuses are hollow spaces in the bones around your face. This condition is caused by tissues in your nose that become inflamed or swollen. This traps germs. These can lead to infection. If you were prescribed an antibiotic medicine, take it as told by your doctor. Do not stop taking it even if you start to feel better. Keep all follow-up visits as told by your doctor. This is important. This information is not intended to replace advice given to you by your health care provider. Make sure you discuss any questions you have with your healthcare provider. Document Revised: 04/23/2018 Document Reviewed: 04/23/2018 Elsevier Patient Education  2022 [employer].   Acute Bronchitis, Adult  Acute bronchitis is when air tubes in the lungs (bronchi) suddenly get swollen. The condition can make it hard for you to breathe. In adults, acute bronchitis usually goes away within 2 weeks. A cough caused by bronchitis may last up to 3 weeks. Smoking, allergies, and asthma can make thecondition worse. What are the causes? This condition is caused by: Cold and flu viruses. The most common cause of this condition is the virus that causes the common cold. Bacteria. Substances that irritate the lungs, including: Smoke from cigarettes and other types of tobacco. Dust and pollen. Fumes from chemicals, gases, or burned fuel. Other materials that pollute indoor or outdoor air. Close contact with someone who has acute bronchitis. What increases the risk? The following factors may make you more likely to develop this condition: A weak body's defense system. This is also called the immune system. Any condition that affects your lungs and breathing, such as asthma. What are the signs or  symptoms? Symptoms of this condition include: A cough. Coughing up clear, yellow, or green mucus. Wheezing. Having too much mucus in your lungs (chest congestion). Shortness of breath. A fever. Chills. Body aches. A sore throat. How is this treated? Acute bronchitis may go away over time without treatment. Your doctor may recommend: Drinking more fluids. Using a device that gets medicine into your  lungs (inhaler). Using a vaporizer or a humidifier. These are machines that add water or moisture to the air. This helps with coughing and poor breathing. Taking a medicine for fever. Taking a medicine that thins mucus and clears congestion. Taking a medicine that prevents or stops coughing. Follow these instructions at home: Activity Get a lot of rest. Return to your normal activities as told by your doctor. Ask your doctor what activities are safe for you. Lifestyle  Drink enough fluid to keep your pee (urine) pale yellow. Do not drink alcohol. Do not use any products that contain nicotine or tobacco, such as cigarettes, e-cigarettes, and chewing tobacco. If you need help quitting, ask your doctor. Be aware that: Your bronchitis will get worse if you smoke or breathe in other people's smoke (secondhand smoke). Your lungs will heal faster if you quit smoking.  General instructions Take over-the-counter and prescription medicines only as told by your doctor. Use an inhaler, cool mist vaporizer, or humidifier as told by your doctor. Rinse your mouth often with salt water. To make salt water, dissolve -1 tsp (3-6 g) of salt in 1 cup (237 mL) of warm water. Take two teaspoons of honey at bedtime. This helps lessen your coughing at night. Keep all follow-up visits as told by your doctor. This is important. How is this prevented? To lower your risk of getting this condition again: Wash your hands often with soap and water. If you cannot use soap and water, use hand sanitizer. Avoid  contact with people who have cold symptoms. Try not to touch your mouth, nose, or eyes with your hands. Make sure to get the flu shot every year. Contact a doctor if: Your symptoms do not get better in 2 weeks. You vomit more than once or twice. You have symptoms of loss of fluid from your body (dehydration). These include: Dark pee. Dry skin or eyes. Increased thirst. Headaches. Confusion. Muscle cramps. Get help right away if: You cough up blood. You have chest pain. You have very bad shortness of breath. You become dehydrated. You faint or keep feeling like you are going to faint. You have a very bad headache. Your fever or chills get worse. These symptoms may be an emergency. Get help right away. Call your local emergency services (911 in the [place] Do not wait to see if the symptoms will go away. Do not drive yourself to the hospital. Summary Acute bronchitis is when air tubes in the lungs (bronchi) suddenly get swollen. In adults, acute bronchitis usually goes away within 2 weeks. Take over-the-counter and prescription medicines only as told by your doctor. Drink enough fluid to keep your pee (urine) pale yellow. Contact a doctor if your symptoms do not improve after 2 weeks of treatment. Get help right away if you cough up blood, faint, or have chest pain or shortness of breath. This information is not intended to replace advice given to you by your health care provider. Make sure you discuss any questions you have with your healthcare provider. Document Revised: 10/21/2020 Document Reviewed: 06/14/2019 Elsevier Patient Education  [place].

## 2021-06-30 DIAGNOSIS — M5386 Other specified dorsopathies, lumbar region: Secondary | ICD-10-CM | POA: Diagnosis not present

## 2021-06-30 DIAGNOSIS — M9903 Segmental and somatic dysfunction of lumbar region: Secondary | ICD-10-CM | POA: Diagnosis not present

## 2021-06-30 DIAGNOSIS — M9902 Segmental and somatic dysfunction of thoracic region: Secondary | ICD-10-CM | POA: Diagnosis not present

## 2021-06-30 DIAGNOSIS — M9901 Segmental and somatic dysfunction of cervical region: Secondary | ICD-10-CM | POA: Diagnosis not present

## 2021-06-30 DIAGNOSIS — M9904 Segmental and somatic dysfunction of sacral region: Secondary | ICD-10-CM | POA: Diagnosis not present

## 2021-07-10 ENCOUNTER — Other Ambulatory Visit: Payer: Self-pay | Admitting: Physician Assistant

## 2021-07-10 DIAGNOSIS — B9689 Other specified bacterial agents as the cause of diseases classified elsewhere: Secondary | ICD-10-CM

## 2021-07-10 DIAGNOSIS — J0140 Acute pansinusitis, unspecified: Secondary | ICD-10-CM

## 2021-07-13 DIAGNOSIS — M9903 Segmental and somatic dysfunction of lumbar region: Secondary | ICD-10-CM | POA: Diagnosis not present

## 2021-07-13 DIAGNOSIS — M5386 Other specified dorsopathies, lumbar region: Secondary | ICD-10-CM | POA: Diagnosis not present

## 2021-07-13 DIAGNOSIS — M9902 Segmental and somatic dysfunction of thoracic region: Secondary | ICD-10-CM | POA: Diagnosis not present

## 2021-07-13 DIAGNOSIS — M9901 Segmental and somatic dysfunction of cervical region: Secondary | ICD-10-CM | POA: Diagnosis not present

## 2021-07-13 DIAGNOSIS — M9904 Segmental and somatic dysfunction of sacral region: Secondary | ICD-10-CM | POA: Diagnosis not present

## 2021-07-27 DIAGNOSIS — M9903 Segmental and somatic dysfunction of lumbar region: Secondary | ICD-10-CM | POA: Diagnosis not present

## 2021-07-27 DIAGNOSIS — M9904 Segmental and somatic dysfunction of sacral region: Secondary | ICD-10-CM | POA: Diagnosis not present

## 2021-07-27 DIAGNOSIS — M9901 Segmental and somatic dysfunction of cervical region: Secondary | ICD-10-CM | POA: Diagnosis not present

## 2021-07-27 DIAGNOSIS — M5386 Other specified dorsopathies, lumbar region: Secondary | ICD-10-CM | POA: Diagnosis not present

## 2021-07-27 DIAGNOSIS — M9902 Segmental and somatic dysfunction of thoracic region: Secondary | ICD-10-CM | POA: Diagnosis not present

## 2021-10-06 DIAGNOSIS — H04123 Dry eye syndrome of bilateral lacrimal glands: Secondary | ICD-10-CM | POA: Diagnosis not present

## 2021-10-06 DIAGNOSIS — H01004 Unspecified blepharitis left upper eyelid: Secondary | ICD-10-CM | POA: Diagnosis not present

## 2021-10-06 DIAGNOSIS — H01001 Unspecified blepharitis right upper eyelid: Secondary | ICD-10-CM | POA: Diagnosis not present

## 2021-10-06 DIAGNOSIS — H2513 Age-related nuclear cataract, bilateral: Secondary | ICD-10-CM | POA: Diagnosis not present

## 2021-10-12 DIAGNOSIS — I781 Nevus, non-neoplastic: Secondary | ICD-10-CM | POA: Diagnosis not present

## 2021-10-12 DIAGNOSIS — L570 Actinic keratosis: Secondary | ICD-10-CM | POA: Diagnosis not present

## 2021-10-12 DIAGNOSIS — D225 Melanocytic nevi of trunk: Secondary | ICD-10-CM | POA: Diagnosis not present

## 2021-10-12 DIAGNOSIS — L814 Other melanin hyperpigmentation: Secondary | ICD-10-CM | POA: Diagnosis not present

## 2021-10-12 DIAGNOSIS — L821 Other seborrheic keratosis: Secondary | ICD-10-CM | POA: Diagnosis not present

## 2021-11-09 DIAGNOSIS — L570 Actinic keratosis: Secondary | ICD-10-CM | POA: Diagnosis not present

## 2021-11-10 DIAGNOSIS — M5386 Other specified dorsopathies, lumbar region: Secondary | ICD-10-CM | POA: Diagnosis not present

## 2021-11-10 DIAGNOSIS — M9904 Segmental and somatic dysfunction of sacral region: Secondary | ICD-10-CM | POA: Diagnosis not present

## 2021-11-10 DIAGNOSIS — M9903 Segmental and somatic dysfunction of lumbar region: Secondary | ICD-10-CM | POA: Diagnosis not present

## 2021-11-10 DIAGNOSIS — M9901 Segmental and somatic dysfunction of cervical region: Secondary | ICD-10-CM | POA: Diagnosis not present

## 2021-11-10 DIAGNOSIS — M9902 Segmental and somatic dysfunction of thoracic region: Secondary | ICD-10-CM | POA: Diagnosis not present

## 2022-01-04 DIAGNOSIS — M9904 Segmental and somatic dysfunction of sacral region: Secondary | ICD-10-CM | POA: Diagnosis not present

## 2022-01-04 DIAGNOSIS — M5386 Other specified dorsopathies, lumbar region: Secondary | ICD-10-CM | POA: Diagnosis not present

## 2022-01-04 DIAGNOSIS — M9901 Segmental and somatic dysfunction of cervical region: Secondary | ICD-10-CM | POA: Diagnosis not present

## 2022-01-04 DIAGNOSIS — M9902 Segmental and somatic dysfunction of thoracic region: Secondary | ICD-10-CM | POA: Diagnosis not present

## 2022-01-04 DIAGNOSIS — M9903 Segmental and somatic dysfunction of lumbar region: Secondary | ICD-10-CM | POA: Diagnosis not present

## 2022-02-08 DIAGNOSIS — M9902 Segmental and somatic dysfunction of thoracic region: Secondary | ICD-10-CM | POA: Diagnosis not present

## 2022-02-08 DIAGNOSIS — M9904 Segmental and somatic dysfunction of sacral region: Secondary | ICD-10-CM | POA: Diagnosis not present

## 2022-02-08 DIAGNOSIS — M9903 Segmental and somatic dysfunction of lumbar region: Secondary | ICD-10-CM | POA: Diagnosis not present

## 2022-02-08 DIAGNOSIS — M9901 Segmental and somatic dysfunction of cervical region: Secondary | ICD-10-CM | POA: Diagnosis not present

## 2022-02-08 DIAGNOSIS — M5386 Other specified dorsopathies, lumbar region: Secondary | ICD-10-CM | POA: Diagnosis not present

## 2022-02-17 DIAGNOSIS — M5386 Other specified dorsopathies, lumbar region: Secondary | ICD-10-CM | POA: Diagnosis not present

## 2022-02-17 DIAGNOSIS — M9901 Segmental and somatic dysfunction of cervical region: Secondary | ICD-10-CM | POA: Diagnosis not present

## 2022-02-17 DIAGNOSIS — M9904 Segmental and somatic dysfunction of sacral region: Secondary | ICD-10-CM | POA: Diagnosis not present

## 2022-02-17 DIAGNOSIS — M9902 Segmental and somatic dysfunction of thoracic region: Secondary | ICD-10-CM | POA: Diagnosis not present

## 2022-02-17 DIAGNOSIS — M9903 Segmental and somatic dysfunction of lumbar region: Secondary | ICD-10-CM | POA: Diagnosis not present

## 2022-02-23 DIAGNOSIS — M9902 Segmental and somatic dysfunction of thoracic region: Secondary | ICD-10-CM | POA: Diagnosis not present

## 2022-02-23 DIAGNOSIS — M9903 Segmental and somatic dysfunction of lumbar region: Secondary | ICD-10-CM | POA: Diagnosis not present

## 2022-02-23 DIAGNOSIS — M5386 Other specified dorsopathies, lumbar region: Secondary | ICD-10-CM | POA: Diagnosis not present

## 2022-02-23 DIAGNOSIS — M9901 Segmental and somatic dysfunction of cervical region: Secondary | ICD-10-CM | POA: Diagnosis not present

## 2022-02-23 DIAGNOSIS — M9904 Segmental and somatic dysfunction of sacral region: Secondary | ICD-10-CM | POA: Diagnosis not present

## 2022-03-01 DIAGNOSIS — M5386 Other specified dorsopathies, lumbar region: Secondary | ICD-10-CM | POA: Diagnosis not present

## 2022-03-01 DIAGNOSIS — M9904 Segmental and somatic dysfunction of sacral region: Secondary | ICD-10-CM | POA: Diagnosis not present

## 2022-03-01 DIAGNOSIS — M9901 Segmental and somatic dysfunction of cervical region: Secondary | ICD-10-CM | POA: Diagnosis not present

## 2022-03-01 DIAGNOSIS — M9903 Segmental and somatic dysfunction of lumbar region: Secondary | ICD-10-CM | POA: Diagnosis not present

## 2022-03-01 DIAGNOSIS — M9902 Segmental and somatic dysfunction of thoracic region: Secondary | ICD-10-CM | POA: Diagnosis not present

## 2022-03-08 DIAGNOSIS — M9903 Segmental and somatic dysfunction of lumbar region: Secondary | ICD-10-CM | POA: Diagnosis not present

## 2022-03-08 DIAGNOSIS — M9901 Segmental and somatic dysfunction of cervical region: Secondary | ICD-10-CM | POA: Diagnosis not present

## 2022-03-08 DIAGNOSIS — M5386 Other specified dorsopathies, lumbar region: Secondary | ICD-10-CM | POA: Diagnosis not present

## 2022-03-08 DIAGNOSIS — M9902 Segmental and somatic dysfunction of thoracic region: Secondary | ICD-10-CM | POA: Diagnosis not present

## 2022-03-08 DIAGNOSIS — M9904 Segmental and somatic dysfunction of sacral region: Secondary | ICD-10-CM | POA: Diagnosis not present

## 2022-03-22 DIAGNOSIS — M9902 Segmental and somatic dysfunction of thoracic region: Secondary | ICD-10-CM | POA: Diagnosis not present

## 2022-03-22 DIAGNOSIS — M9904 Segmental and somatic dysfunction of sacral region: Secondary | ICD-10-CM | POA: Diagnosis not present

## 2022-03-22 DIAGNOSIS — M5386 Other specified dorsopathies, lumbar region: Secondary | ICD-10-CM | POA: Diagnosis not present

## 2022-03-22 DIAGNOSIS — M9901 Segmental and somatic dysfunction of cervical region: Secondary | ICD-10-CM | POA: Diagnosis not present

## 2022-03-22 DIAGNOSIS — M9903 Segmental and somatic dysfunction of lumbar region: Secondary | ICD-10-CM | POA: Diagnosis not present

## 2022-03-29 DIAGNOSIS — M5386 Other specified dorsopathies, lumbar region: Secondary | ICD-10-CM | POA: Diagnosis not present

## 2022-03-29 DIAGNOSIS — M9901 Segmental and somatic dysfunction of cervical region: Secondary | ICD-10-CM | POA: Diagnosis not present

## 2022-03-29 DIAGNOSIS — M9902 Segmental and somatic dysfunction of thoracic region: Secondary | ICD-10-CM | POA: Diagnosis not present

## 2022-03-29 DIAGNOSIS — M9904 Segmental and somatic dysfunction of sacral region: Secondary | ICD-10-CM | POA: Diagnosis not present

## 2022-03-29 DIAGNOSIS — M9903 Segmental and somatic dysfunction of lumbar region: Secondary | ICD-10-CM | POA: Diagnosis not present

## 2022-05-18 ENCOUNTER — Encounter: Payer: Self-pay | Admitting: Physician Assistant

## 2022-05-19 ENCOUNTER — Encounter: Payer: Self-pay | Admitting: Medical

## 2022-05-21 ENCOUNTER — Other Ambulatory Visit: Payer: Self-pay | Admitting: Medical

## 2022-05-23 NOTE — Progress Notes (Unsigned)
Subjective:   Gregory Tate is a 71 y.o. male who presents for Medicare Annual/Subsequent preventive examination.  Review of Systems     Cardiac Risk Factors include: obesity (BMI >30kg/m2);diabetes mellitus;hypertension;male gender;dyslipidemia;advanced age (>42mn, >>46women)     Objective:    Today's Vitals   05/24/22 1010 05/24/22 1011  BP: (!) 143/85   Pulse: 80   Resp: 16   Temp: 97.9 F (36.6 C)   SpO2: 99%   Weight: 239 lb (108.4 kg)   Height: '6\' 1"'$  (1.854 m)   PainSc:  7    Body mass index is 31.53 kg/m.     05/24/2022   10:05 AM 05/18/2021   11:39 AM 04/02/2020   10:11 AM 04/02/2019    9:18 AM 03/29/2018   10:13 AM  Advanced Directives  Does Patient Have a Medical Advance Directive? Yes Yes Yes Yes Yes  Type of [place] of [place] DNR (pink MOST or yellow form);Living will [place] will [place]Living will [place] DellLiving will  Does patient want to make changes to medical advance directive? No - Patient declined  No - Patient declined No - Patient declined No - Patient declined  Copy of [place] Chart? No - copy requested No - copy requested No - copy requested No - copy requested No - copy requested    Current Medications (verified) Outpatient Encounter Medications as of 05/24/2022  Medication Sig   albuterol (VENTOLIN HFA) 108 (90 Base) MCG/ACT inhaler Inhale 2 puffs into the lungs every 6 (six) hours as needed for wheezing or shortness of breath.   aspirin 81 MG tablet Take 81 mg by mouth daily.     Cholecalciferol (VITAMIN D3) 1000 UNITS tablet Take 1,000 Units by mouth daily.     cyclobenzaprine (FLEXERIL) 5 MG tablet Take 1 tablet (5 mg total) by mouth at bedtime.   doxycycline (VIBRAMYCIN) 100 MG capsule Take 1 capsule (100 mg total) by mouth 2 (two) times daily.    lisinopril-hydrochlorothiazide (ZESTORETIC) 20-25 MG tablet TAKE 1 TABLET BY MOUTH EVERY DAY   mupirocin ointment (BACTROBAN) 2 % Apply thin film twice daily   NON FORMULARY Cherry Extract as needed for Gout   simvastatin (ZOCOR) 40 MG tablet TAKE 1 TABLET BY MOUTH EVERYDAY AT BEDTIME   [DISCONTINUED] benzonatate (TESSALON) 100 MG capsule Take 1 capsule (100 mg total) by mouth 3 (three) times daily as needed.   [DISCONTINUED] predniSONE (STERAPRED UNI-PAK 21 TAB) 10 MG (21) TBPK tablet 6 day taper; take as directed on package instructions   No facility-administered encounter medications on file as of 05/24/2022.    Allergies (verified) Patient has no known allergies.   History: 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 NO MX   Hyperlipidemia    Hypertension    Mumps    Shingles 08/05/2017   Past Surgical History:  Procedure Laterality Date   COLONOSCOPY  2007   TONSILLECTOMY     URINARY DIVERSION  2002   radical cystoprostatecomy and urinary diversion    Family History  Problem Relation Age of Onset   Coronary artery disease Father 868  Deceased   Hypertension Father    Alzheimer's disease Father    Hypertension Mother        Living   Diabetes Brother        borderline #1   Stroke Paternal Grandfather    Heart attack Maternal Grandfather    Arthritis Other    Hypertension Brother        #2   Heart disease Brother        #2   Healthy Son        x1   Social History   Socioeconomic History   Marital status: Married    Spouse name: Not on file   Number of children: Not on file   Years of education: Not on file   Highest education level: Not on file  Occupational History   Not on file  Tobacco Use   Smoking status: Never   Smokeless tobacco: Never   Substance and Sexual Activity   Alcohol use: Yes    Alcohol/week: 3.0 standard drinks of alcohol    Types: 3 Cans of beer per week   Drug use: No   Sexual activity: Not Currently  Other Topics Concern   Not on file  Social History Narrative   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      [place]   Married for 54 year    one son 58    New grandchild   Alcohol use-yes (social)  < 14 drinks per week.   Social Determinants of Health   Financial Resource Strain: Low Risk  (05/18/2021)   Overall Financial Resource Strain (CARDIA)    Difficulty of Paying Living Expenses: Not hard at all  Food Insecurity: No Food Insecurity (05/18/2021)   Hunger Vital Sign    Worried About Running Out of Food in the Last Year: Never true    Ran Out of Food in the Last Year: Never true  Transportation Needs: No Transportation Needs (05/18/2021)   PRAPARE - Hydrologist (Medical): No    Lack of Transportation (Non-Medical): No  Physical Activity: Sufficiently Active (05/18/2021)   Exercise Vital Sign    Days of Exercise per Week: 7 days    Minutes of Exercise per Session: 40 min  Stress: No Stress Concern Present (05/18/2021)   [place]    Feeling of Stress : Not at all  Social Connections: Moderately Isolated (05/18/2021)   Social Connection and Isolation Panel [NHANES]    Frequency of Communication with Friends and Family: More than three times a week    Frequency of Social Gatherings with Friends and Family: More than three times a week    Attends Religious Services: Never    Marine scientist or Organizations: No    Attends Music therapist: Never    Marital Status: Married    Tobacco Counseling Counseling given: Not Answered   Clinical Intake:  Pre-visit preparation completed: Yes  Pain : 0-10 Pain Score: 7  Pain Type:  Chronic pain Pain Location: Hip Pain Descriptors / Indicators: Jabbing, Aching, Sore Pain Onset: More than a month ago Pain Frequency: Intermittent     BMI - recorded: 31.53 Nutritional Status: BMI > 30  Obese Nutritional Risks: None Diabetes: No  How often do you need to have someone help you when you read instructions, pamphlets, or other written materials from your doctor or pharmacy?:  1 - Never  Diabetic?No  Interpreter Needed?: No  Information entered by :: [place] of Daily Living    05/24/2022   10:14 AM  In your present state of health, do you have any difficulty performing the following activities:  Hearing? 0  Vision? 0  Difficulty concentrating or making decisions? 0  Walking or climbing stairs? 1  Dressing or bathing? 0  Doing errands, shopping? 0  Preparing Food and eating ? N  Using the Toilet? N  In the past six months, have you accidently leaked urine? Y  Do you have problems with loss of bowel control? N  Managing your Medications? N  Managing your Finances? N  Housekeeping or managing your Housekeeping? N    Patient Care Team: Saguier, Iris Pert as PCP - General (Internal Medicine) Franchot Gallo, MD as Consulting Physician (Urology) Selina Cooley, MD as Referring Physician (Dermatology)  Indicate any recent Medical Services you may have received from other than [facility] providers in the past year (date may be approximate).     Assessment:   This is a routine wellness examination for Gregory Tate.  Hearing/Vision screen No results found.  Dietary issues and exercise activities discussed: Current Exercise Habits: Home exercise routine, Type of exercise: walking;strength training/weights;stretching, Time (Minutes): 60, Frequency (Times/Week): 7, Weekly Exercise (Minutes/Week): 420, Exercise limited by: None identified   Goals Addressed             This Visit's Progress    DIET - INCREASE WATER INTAKE   On track       Depression Screen    05/18/2021   11:42 AM 04/02/2020   10:14 AM 04/02/2019    9:20 AM 03/29/2018   10:14 AM 01/15/2016    8:11 AM  PHQ 2/9 Scores  PHQ - 2 Score 0 0 0 0 0    Fall Risk    05/24/2022   10:06 AM 05/18/2021   11:42 AM 04/02/2020   10:14 AM 04/02/2019    9:20 AM 03/29/2018   10:14 AM  Fall Risk   Falls in the past year? 0 0 1 0 No  Number falls in past yr: 0 0 0    Injury with Fall? 0 0 1    Risk for fall due to : History of fall(s)      Follow up Falls evaluation completed Falls prevention discussed Education provided;Falls prevention discussed      FALL RISK PREVENTION PERTAINING TO THE HOME:  Any stairs in or around the home? Yes  If so, are there any without handrails? No  Home free of loose throw rugs in walkways, pet beds, electrical cords, etc? Yes  Adequate lighting in your home to reduce risk of falls? Yes   ASSISTIVE DEVICES UTILIZED TO PREVENT FALLS:  Life alert? No  Use of a cane, walker or w/c? No  Grab bars in the bathroom? Yes  Shower chair or bench in shower? Yes  Elevated toilet seat or a handicapped toilet? Yes   TIMED UP AND GO:  Was the test performed? Yes .  Length of time to ambulate 10 feet: 9 sec.   Gait steady and fast without use of assistive device  Cognitive Function:        05/24/2022   10:16 AM  6CIT Screen  What Year? 0 points  What month? 0 points  What time? 0 points  Count back from 20 0 points  Months in reverse 0 points  Repeat phrase 2 points  Total Score 2 points    Immunizations Immunization History  Administered Date(s) Administered   Influenza Whole 11/30/2007, 08/28/2009   Influenza-Unspecified 04/05/2015   PFIZER(Purple Top)SARS-COV-2 Vaccination 01/02/2020, 01/30/2020, 02/01/2021   Pneumococcal Conjugate-13 02/24/2015   Pneumococcal Polysaccharide-23 01/02/2017   Td 05/22/2006   Tdap 01/02/2017    TDAP status: Up to date  Flu Vaccine status: Declined, Education has been provided regarding  the importance of this vaccine but patient still declined. Advised may receive this vaccine at local pharmacy or Health Dept. Aware to provide a copy of the vaccination record if obtained from local pharmacy or Health Dept. Verbalized acceptance and understanding.  Pneumococcal vaccine status: Up to date  Covid-19 vaccine status: Information provided on how to obtain vaccines.   Qualifies for Shingles Vaccine? Yes   Zostavax completed No   Shingrix Completed?: No.    Education has been provided regarding the importance of this vaccine. Patient has been advised to call insurance company to determine out of pocket expense if they have not yet received this vaccine. Advised may also receive vaccine at local pharmacy or Health Dept. Verbalized acceptance and understanding.  Screening Tests Health Maintenance  Topic Date Due   Zoster Vaccines- Shingrix (1 of 2) Never done   COVID-19 Vaccine (4 - Pfizer series) 03/29/2021   INFLUENZA VACCINE  07/05/2022   TETANUS/TDAP  01/02/2027   COLONOSCOPY (Pts 45-45yr Insurance coverage will need to be confirmed)  01/17/2027   Pneumonia Vaccine 71 Years old  Completed   Hepatitis C Screening  Completed   HPV VACCINES  Aged Out    Health Maintenance  Health Maintenance Due  Topic Date Due   Zoster Vaccines- Shingrix (1 of 2) Never done   COVID-19 Vaccine (4 - Pfizer series) 03/29/2021    Colorectal cancer screening: Type of screening: Colonoscopy. Completed 01/17/17. Repeat every 10 years  Lung Cancer Screening: (Low Dose CT Chest recommended if Age 364-80years, 30 pack-year currently smoking OR have quit w/in 15years.) does not qualify.   Lung Cancer Screening Referral: n/a  Additional Screening:  Hepatitis C Screening: does qualify; Completed 01/02/17  Vision Screening: Recommended annual ophthalmology exams for early detection of glaucoma and other disorders of the eye. Is the patient up to date with their annual eye exam?  Yes  Who is the  provider or what is the name of the office in which the patient attends annual eye exams? [facility] If pt is not established with a provider, would they like to be referred to a provider to establish care? No .   Dental Screening: Recommended annual dental exams for proper oral hygiene  Community Resource Referral / Chronic Care Management: CRR required this visit?  No   CCM required this visit?  No      Plan:     I have personally reviewed and noted the following in the patient's chart:   Medical and social history Use of alcohol, tobacco or illicit drugs  Current medications and supplements including opioid prescriptions. Patient is not currently taking opioid prescriptions. Functional ability and status Nutritional status Physical activity Advanced directives List of other physicians Hospitalizations, surgeries, and ER visits in previous 12 months Vitals Screenings to include cognitive, depression, and falls Referrals and appointments  In addition, I have reviewed and discussed with patient certain preventive protocols, quality metrics, and best practice recommendations. A written personalized care plan for preventive services as well as general preventive health recommendations were provided to patient.     SInda Castle  A Sebastin Perlmutter, CMA   05/24/2022   Nurse Notes: none

## 2022-05-24 ENCOUNTER — Ambulatory Visit (INDEPENDENT_AMBULATORY_CARE_PROVIDER_SITE_OTHER): Payer: Medicare Other

## 2022-05-24 ENCOUNTER — Ambulatory Visit (HOSPITAL_BASED_OUTPATIENT_CLINIC_OR_DEPARTMENT_OTHER)
Admission: RE | Admit: 2022-05-24 | Discharge: 2022-05-24 | Disposition: A | Discharge status: Home / Self Care | Payer: Medicare Other | Source: Ambulatory Visit | Attending: Family Medicine | Admitting: Family Medicine

## 2022-05-24 VITALS — BP 143/85 | HR 80 | Temp 97.9°F | Resp 16 | Ht 73.0 in | Wt 239.0 lb

## 2022-05-24 DIAGNOSIS — M25551 Pain in right hip: Secondary | ICD-10-CM | POA: Diagnosis not present

## 2022-05-24 DIAGNOSIS — M25552 Pain in left hip: Secondary | ICD-10-CM | POA: Insufficient documentation

## 2022-05-24 DIAGNOSIS — R102 Pelvic and perineal pain: Secondary | ICD-10-CM | POA: Diagnosis not present

## 2022-05-24 DIAGNOSIS — Z0000 Encounter for general adult medical examination without abnormal findings: Secondary | ICD-10-CM

## 2022-05-24 NOTE — Patient Instructions (Signed)
Gregory Tate , Thank you for taking time to come for your Medicare Wellness Visit. I appreciate your ongoing commitment to your health goals. Please review the following plan we discussed and let me know if I can assist you in the future.   Screening recommendations/referrals: Colonoscopy: 01/22/17 due 01/22/27 Recommended yearly ophthalmology/optometry visit for glaucoma screening and checkup Recommended yearly dental visit for hygiene and checkup  Vaccinations: Influenza vaccine: declined Pneumococcal vaccine: up to date Tdap vaccine: up to date Shingles vaccine: Due-May obtain vaccine at your local pharmacy.    Covid-19: Due-May obtain vaccine at  your local pharmacy.   Advanced directives: yes, not on file  Conditions/risks identified: see problem list   Next appointment: Follow up in one year for your annual wellness visit. 05/30/23  Preventive Care 65 Years and Older, Male Preventive care refers to lifestyle choices and visits with your health care provider that can promote health and wellness. What does preventive care include? A yearly physical exam. This is also called an annual well check. Dental exams once or twice a year. Routine eye exams. Ask your health care provider how often you should have your eyes checked. Personal lifestyle choices, including: Daily care of your teeth and gums. Regular physical activity. Eating a healthy diet. Avoiding tobacco and drug use. Limiting alcohol use. Practicing safe sex. Taking low doses of aspirin every day. Taking vitamin and mineral supplements as recommended by your health care provider. What happens during an annual well check? The services and screenings done by your health care provider during your annual well check will depend on your age, overall health, lifestyle risk factors, and family history of disease. Counseling  Your health care provider may ask you questions about your: Alcohol use. Tobacco use. Drug  use. Emotional well-being. Home and relationship well-being. Sexual activity. Eating habits. History of falls. Memory and ability to understand (cognition). Work and work Statistician. Screening  You may have the following tests or measurements: Height, weight, and BMI. Blood pressure. Lipid and cholesterol levels. These may be checked every 5 years, or more frequently if you are over 69 years old. Skin check. Lung cancer screening. You may have this screening every year starting at age 109 if you have a 30-pack-year history of smoking and currently smoke or have quit within the past 15 years. Fecal occult blood test (FOBT) of the stool. You may have this test every year starting at age 66. Flexible sigmoidoscopy or colonoscopy. You may have a sigmoidoscopy every 5 years or a colonoscopy every 10 years starting at age 44. Prostate cancer screening. Recommendations will vary depending on your family history and other risks. Hepatitis C blood test. Hepatitis B blood test. Sexually transmitted disease (STD) testing. Diabetes screening. This is done by checking your blood sugar (glucose) after you have not eaten for a while (fasting). You may have this done every 1-3 years. Abdominal aortic aneurysm (AAA) screening. You may need this if you are a current or former smoker. Osteoporosis. You may be screened starting at age 57 if you are at high risk. Talk with your health care provider about your test results, treatment options, and if necessary, the need for more tests. Vaccines  Your health care provider may recommend certain vaccines, such as: Influenza vaccine. This is recommended every year. Tetanus, diphtheria, and acellular pertussis (Tdap, Td) vaccine. You may need a Td booster every 10 years. Zoster vaccine. You may need this after age 30. Pneumococcal 13-valent conjugate (PCV13) vaccine. One dose is  recommended after age 66. Pneumococcal polysaccharide (PPSV23) vaccine. One dose is  recommended after age 78. Talk to your health care provider about which screenings and vaccines you need and how often you need them. This information is not intended to replace advice given to you by your health care provider. Make sure you discuss any questions you have with your health care provider. Document Released: 12/18/2015 Document Revised: 08/10/2016 Document Reviewed: 09/22/2015 Elsevier Interactive Patient Education  2017 [place] Prevention in the Home Falls can cause injuries. They can happen to people of all ages. There are many things you can do to make your home safe and to help prevent falls. What can I do on the outside of my home? Regularly fix the edges of walkways and driveways and fix any cracks. Remove anything that might make you trip as you walk through a door, such as a raised step or threshold. Trim any bushes or trees on the path to your home. Use bright outdoor lighting. Clear any walking paths of anything that might make someone trip, such as rocks or tools. Regularly check to see if handrails are loose or broken. Make sure that both sides of any steps have handrails. Any raised decks and porches should have guardrails on the edges. Have any leaves, snow, or ice cleared regularly. Use sand or salt on walking paths during winter. Clean up any spills in your garage right away. This includes oil or grease spills. What can I do in the bathroom? Use night lights. Install grab bars by the toilet and in the tub and shower. Do not use towel bars as grab bars. Use non-skid mats or decals in the tub or shower. If you need to sit down in the shower, use a plastic, non-slip stool. Keep the floor dry. Clean up any water that spills on the floor as soon as it happens. Remove soap buildup in the tub or shower regularly. Attach bath mats securely with double-sided non-slip rug tape. Do not have throw rugs and other things on the floor that can make you  trip. What can I do in the bedroom? Use night lights. Make sure that you have a light by your bed that is easy to reach. Do not use any sheets or blankets that are too big for your bed. They should not hang down onto the floor. Have a firm chair that has side arms. You can use this for support while you get dressed. Do not have throw rugs and other things on the floor that can make you trip. What can I do in the kitchen? Clean up any spills right away. Avoid walking on wet floors. Keep items that you use a lot in easy-to-reach places. If you need to reach something above you, use a strong step stool that has a grab bar. Keep electrical cords out of the way. Do not use floor polish or wax that makes floors slippery. If you must use wax, use non-skid floor wax. Do not have throw rugs and other things on the floor that can make you trip. What can I do with my stairs? Do not leave any items on the stairs. Make sure that there are handrails on both sides of the stairs and use them. Fix handrails that are broken or loose. Make sure that handrails are as long as the stairways. Check any carpeting to make sure that it is firmly attached to the stairs. Fix any carpet that is loose or worn. Avoid having  throw rugs at the top or bottom of the stairs. If you do have throw rugs, attach them to the floor with carpet tape. Make sure that you have a light switch at the top of the stairs and the bottom of the stairs. If you do not have them, ask someone to add them for you. What else can I do to help prevent falls? Wear shoes that: Do not have high heels. Have rubber bottoms. Are comfortable and fit you well. Are closed at the toe. Do not wear sandals. If you use a stepladder: Make sure that it is fully opened. Do not climb a closed stepladder. Make sure that both sides of the stepladder are locked into place. Ask someone to hold it for you, if possible. Clearly mark and make sure that you can  see: Any grab bars or handrails. First and last steps. Where the edge of each step is. Use tools that help you move around (mobility aids) if they are needed. These include: Canes. Walkers. Scooters. Crutches. Turn on the lights when you go into a dark area. Replace any light bulbs as soon as they burn out. Set up your furniture so you have a clear path. Avoid moving your furniture around. If any of your floors are uneven, fix them. If there are any pets around you, be aware of where they are. Review your medicines with your doctor. Some medicines can make you feel dizzy. This can increase your chance of falling. Ask your doctor what other things that you can do to help prevent falls. This information is not intended to replace advice given to you by your health care provider. Make sure you discuss any questions you have with your health care provider. Document Released: 09/17/2009 Document Revised: 04/28/2016 Document Reviewed: 12/26/2014 Elsevier Interactive Patient Education  2017 [employer].

## 2022-05-25 ENCOUNTER — Ambulatory Visit: Payer: Medicare Other

## 2022-05-27 DIAGNOSIS — M9903 Segmental and somatic dysfunction of lumbar region: Secondary | ICD-10-CM | POA: Diagnosis not present

## 2022-05-27 DIAGNOSIS — M9901 Segmental and somatic dysfunction of cervical region: Secondary | ICD-10-CM | POA: Diagnosis not present

## 2022-05-27 DIAGNOSIS — M9904 Segmental and somatic dysfunction of sacral region: Secondary | ICD-10-CM | POA: Diagnosis not present

## 2022-05-27 DIAGNOSIS — M9902 Segmental and somatic dysfunction of thoracic region: Secondary | ICD-10-CM | POA: Diagnosis not present

## 2022-05-27 DIAGNOSIS — M5386 Other specified dorsopathies, lumbar region: Secondary | ICD-10-CM | POA: Diagnosis not present

## 2022-05-31 DIAGNOSIS — M9901 Segmental and somatic dysfunction of cervical region: Secondary | ICD-10-CM | POA: Diagnosis not present

## 2022-05-31 DIAGNOSIS — M5386 Other specified dorsopathies, lumbar region: Secondary | ICD-10-CM | POA: Diagnosis not present

## 2022-05-31 DIAGNOSIS — M9903 Segmental and somatic dysfunction of lumbar region: Secondary | ICD-10-CM | POA: Diagnosis not present

## 2022-05-31 DIAGNOSIS — M9904 Segmental and somatic dysfunction of sacral region: Secondary | ICD-10-CM | POA: Diagnosis not present

## 2022-05-31 DIAGNOSIS — M9902 Segmental and somatic dysfunction of thoracic region: Secondary | ICD-10-CM | POA: Diagnosis not present

## 2022-06-09 DIAGNOSIS — M9902 Segmental and somatic dysfunction of thoracic region: Secondary | ICD-10-CM | POA: Diagnosis not present

## 2022-06-09 DIAGNOSIS — M9903 Segmental and somatic dysfunction of lumbar region: Secondary | ICD-10-CM | POA: Diagnosis not present

## 2022-06-09 DIAGNOSIS — M5386 Other specified dorsopathies, lumbar region: Secondary | ICD-10-CM | POA: Diagnosis not present

## 2022-06-09 DIAGNOSIS — M9904 Segmental and somatic dysfunction of sacral region: Secondary | ICD-10-CM | POA: Diagnosis not present

## 2022-06-09 DIAGNOSIS — M9901 Segmental and somatic dysfunction of cervical region: Secondary | ICD-10-CM | POA: Diagnosis not present

## 2022-06-28 DIAGNOSIS — M9901 Segmental and somatic dysfunction of cervical region: Secondary | ICD-10-CM | POA: Diagnosis not present

## 2022-06-28 DIAGNOSIS — M9902 Segmental and somatic dysfunction of thoracic region: Secondary | ICD-10-CM | POA: Diagnosis not present

## 2022-06-28 DIAGNOSIS — M5386 Other specified dorsopathies, lumbar region: Secondary | ICD-10-CM | POA: Diagnosis not present

## 2022-06-28 DIAGNOSIS — M9904 Segmental and somatic dysfunction of sacral region: Secondary | ICD-10-CM | POA: Diagnosis not present

## 2022-06-28 DIAGNOSIS — M9903 Segmental and somatic dysfunction of lumbar region: Secondary | ICD-10-CM | POA: Diagnosis not present

## 2022-07-05 DIAGNOSIS — M9903 Segmental and somatic dysfunction of lumbar region: Secondary | ICD-10-CM | POA: Diagnosis not present

## 2022-07-05 DIAGNOSIS — M9904 Segmental and somatic dysfunction of sacral region: Secondary | ICD-10-CM | POA: Diagnosis not present

## 2022-07-05 DIAGNOSIS — M5386 Other specified dorsopathies, lumbar region: Secondary | ICD-10-CM | POA: Diagnosis not present

## 2022-07-05 DIAGNOSIS — M9901 Segmental and somatic dysfunction of cervical region: Secondary | ICD-10-CM | POA: Diagnosis not present

## 2022-07-05 DIAGNOSIS — M9902 Segmental and somatic dysfunction of thoracic region: Secondary | ICD-10-CM | POA: Diagnosis not present

## 2022-07-13 DIAGNOSIS — M5386 Other specified dorsopathies, lumbar region: Secondary | ICD-10-CM | POA: Diagnosis not present

## 2022-07-13 DIAGNOSIS — M9904 Segmental and somatic dysfunction of sacral region: Secondary | ICD-10-CM | POA: Diagnosis not present

## 2022-07-13 DIAGNOSIS — M9903 Segmental and somatic dysfunction of lumbar region: Secondary | ICD-10-CM | POA: Diagnosis not present

## 2022-07-13 DIAGNOSIS — M9901 Segmental and somatic dysfunction of cervical region: Secondary | ICD-10-CM | POA: Diagnosis not present

## 2022-07-13 DIAGNOSIS — M9902 Segmental and somatic dysfunction of thoracic region: Secondary | ICD-10-CM | POA: Diagnosis not present

## 2022-07-26 DIAGNOSIS — M9903 Segmental and somatic dysfunction of lumbar region: Secondary | ICD-10-CM | POA: Diagnosis not present

## 2022-07-26 DIAGNOSIS — M9902 Segmental and somatic dysfunction of thoracic region: Secondary | ICD-10-CM | POA: Diagnosis not present

## 2022-07-26 DIAGNOSIS — M9901 Segmental and somatic dysfunction of cervical region: Secondary | ICD-10-CM | POA: Diagnosis not present

## 2022-07-26 DIAGNOSIS — M9904 Segmental and somatic dysfunction of sacral region: Secondary | ICD-10-CM | POA: Diagnosis not present

## 2022-07-26 DIAGNOSIS — M5386 Other specified dorsopathies, lumbar region: Secondary | ICD-10-CM | POA: Diagnosis not present

## 2022-08-02 DIAGNOSIS — M9903 Segmental and somatic dysfunction of lumbar region: Secondary | ICD-10-CM | POA: Diagnosis not present

## 2022-08-02 DIAGNOSIS — M9904 Segmental and somatic dysfunction of sacral region: Secondary | ICD-10-CM | POA: Diagnosis not present

## 2022-08-02 DIAGNOSIS — M9901 Segmental and somatic dysfunction of cervical region: Secondary | ICD-10-CM | POA: Diagnosis not present

## 2022-08-02 DIAGNOSIS — M5386 Other specified dorsopathies, lumbar region: Secondary | ICD-10-CM | POA: Diagnosis not present

## 2022-08-02 DIAGNOSIS — M9902 Segmental and somatic dysfunction of thoracic region: Secondary | ICD-10-CM | POA: Diagnosis not present

## 2022-08-16 DIAGNOSIS — M9903 Segmental and somatic dysfunction of lumbar region: Secondary | ICD-10-CM | POA: Diagnosis not present

## 2022-08-16 DIAGNOSIS — M9904 Segmental and somatic dysfunction of sacral region: Secondary | ICD-10-CM | POA: Diagnosis not present

## 2022-08-16 DIAGNOSIS — M9902 Segmental and somatic dysfunction of thoracic region: Secondary | ICD-10-CM | POA: Diagnosis not present

## 2022-08-16 DIAGNOSIS — M9901 Segmental and somatic dysfunction of cervical region: Secondary | ICD-10-CM | POA: Diagnosis not present

## 2022-08-16 DIAGNOSIS — M5386 Other specified dorsopathies, lumbar region: Secondary | ICD-10-CM | POA: Diagnosis not present

## 2022-08-30 DIAGNOSIS — M9903 Segmental and somatic dysfunction of lumbar region: Secondary | ICD-10-CM | POA: Diagnosis not present

## 2022-08-30 DIAGNOSIS — M9902 Segmental and somatic dysfunction of thoracic region: Secondary | ICD-10-CM | POA: Diagnosis not present

## 2022-08-30 DIAGNOSIS — M9904 Segmental and somatic dysfunction of sacral region: Secondary | ICD-10-CM | POA: Diagnosis not present

## 2022-08-30 DIAGNOSIS — M5386 Other specified dorsopathies, lumbar region: Secondary | ICD-10-CM | POA: Diagnosis not present

## 2022-08-30 DIAGNOSIS — M9901 Segmental and somatic dysfunction of cervical region: Secondary | ICD-10-CM | POA: Diagnosis not present

## 2022-09-13 DIAGNOSIS — M5386 Other specified dorsopathies, lumbar region: Secondary | ICD-10-CM | POA: Diagnosis not present

## 2022-09-13 DIAGNOSIS — M9903 Segmental and somatic dysfunction of lumbar region: Secondary | ICD-10-CM | POA: Diagnosis not present

## 2022-09-13 DIAGNOSIS — M9902 Segmental and somatic dysfunction of thoracic region: Secondary | ICD-10-CM | POA: Diagnosis not present

## 2022-09-13 DIAGNOSIS — M9901 Segmental and somatic dysfunction of cervical region: Secondary | ICD-10-CM | POA: Diagnosis not present

## 2022-09-13 DIAGNOSIS — M9904 Segmental and somatic dysfunction of sacral region: Secondary | ICD-10-CM | POA: Diagnosis not present

## 2022-10-04 DIAGNOSIS — M5386 Other specified dorsopathies, lumbar region: Secondary | ICD-10-CM | POA: Diagnosis not present

## 2022-10-04 DIAGNOSIS — M9904 Segmental and somatic dysfunction of sacral region: Secondary | ICD-10-CM | POA: Diagnosis not present

## 2022-10-04 DIAGNOSIS — M9903 Segmental and somatic dysfunction of lumbar region: Secondary | ICD-10-CM | POA: Diagnosis not present

## 2022-10-04 DIAGNOSIS — M9901 Segmental and somatic dysfunction of cervical region: Secondary | ICD-10-CM | POA: Diagnosis not present

## 2022-10-04 DIAGNOSIS — M9902 Segmental and somatic dysfunction of thoracic region: Secondary | ICD-10-CM | POA: Diagnosis not present

## 2022-10-13 DIAGNOSIS — L814 Other melanin hyperpigmentation: Secondary | ICD-10-CM | POA: Diagnosis not present

## 2022-10-13 DIAGNOSIS — I781 Nevus, non-neoplastic: Secondary | ICD-10-CM | POA: Diagnosis not present

## 2022-10-13 DIAGNOSIS — L821 Other seborrheic keratosis: Secondary | ICD-10-CM | POA: Diagnosis not present

## 2022-10-13 DIAGNOSIS — Z1283 Encounter for screening for malignant neoplasm of skin: Secondary | ICD-10-CM | POA: Diagnosis not present

## 2022-10-13 DIAGNOSIS — L570 Actinic keratosis: Secondary | ICD-10-CM | POA: Diagnosis not present

## 2022-10-13 DIAGNOSIS — D225 Melanocytic nevi of trunk: Secondary | ICD-10-CM | POA: Diagnosis not present

## 2022-11-10 DIAGNOSIS — M9901 Segmental and somatic dysfunction of cervical region: Secondary | ICD-10-CM | POA: Diagnosis not present

## 2022-11-10 DIAGNOSIS — M5386 Other specified dorsopathies, lumbar region: Secondary | ICD-10-CM | POA: Diagnosis not present

## 2022-11-10 DIAGNOSIS — M9902 Segmental and somatic dysfunction of thoracic region: Secondary | ICD-10-CM | POA: Diagnosis not present

## 2022-11-10 DIAGNOSIS — M9903 Segmental and somatic dysfunction of lumbar region: Secondary | ICD-10-CM | POA: Diagnosis not present

## 2022-11-10 DIAGNOSIS — M9904 Segmental and somatic dysfunction of sacral region: Secondary | ICD-10-CM | POA: Diagnosis not present

## 2022-11-16 DIAGNOSIS — M9901 Segmental and somatic dysfunction of cervical region: Secondary | ICD-10-CM | POA: Diagnosis not present

## 2022-11-16 DIAGNOSIS — M5386 Other specified dorsopathies, lumbar region: Secondary | ICD-10-CM | POA: Diagnosis not present

## 2022-11-16 DIAGNOSIS — M9902 Segmental and somatic dysfunction of thoracic region: Secondary | ICD-10-CM | POA: Diagnosis not present

## 2022-11-16 DIAGNOSIS — M9904 Segmental and somatic dysfunction of sacral region: Secondary | ICD-10-CM | POA: Diagnosis not present

## 2022-11-16 DIAGNOSIS — M9903 Segmental and somatic dysfunction of lumbar region: Secondary | ICD-10-CM | POA: Diagnosis not present

## 2022-11-24 DIAGNOSIS — M9901 Segmental and somatic dysfunction of cervical region: Secondary | ICD-10-CM | POA: Diagnosis not present

## 2022-11-24 DIAGNOSIS — M9902 Segmental and somatic dysfunction of thoracic region: Secondary | ICD-10-CM | POA: Diagnosis not present

## 2022-11-24 DIAGNOSIS — M9903 Segmental and somatic dysfunction of lumbar region: Secondary | ICD-10-CM | POA: Diagnosis not present

## 2022-11-24 DIAGNOSIS — M9904 Segmental and somatic dysfunction of sacral region: Secondary | ICD-10-CM | POA: Diagnosis not present

## 2022-11-24 DIAGNOSIS — M5386 Other specified dorsopathies, lumbar region: Secondary | ICD-10-CM | POA: Diagnosis not present

## 2022-12-07 DIAGNOSIS — M9904 Segmental and somatic dysfunction of sacral region: Secondary | ICD-10-CM | POA: Diagnosis not present

## 2022-12-07 DIAGNOSIS — M9902 Segmental and somatic dysfunction of thoracic region: Secondary | ICD-10-CM | POA: Diagnosis not present

## 2022-12-07 DIAGNOSIS — M5386 Other specified dorsopathies, lumbar region: Secondary | ICD-10-CM | POA: Diagnosis not present

## 2022-12-07 DIAGNOSIS — M9903 Segmental and somatic dysfunction of lumbar region: Secondary | ICD-10-CM | POA: Diagnosis not present

## 2022-12-07 DIAGNOSIS — M9901 Segmental and somatic dysfunction of cervical region: Secondary | ICD-10-CM | POA: Diagnosis not present

## 2022-12-14 DIAGNOSIS — H0102A Squamous blepharitis right eye, upper and lower eyelids: Secondary | ICD-10-CM | POA: Diagnosis not present

## 2022-12-14 DIAGNOSIS — H2513 Age-related nuclear cataract, bilateral: Secondary | ICD-10-CM | POA: Diagnosis not present

## 2022-12-14 DIAGNOSIS — H0102B Squamous blepharitis left eye, upper and lower eyelids: Secondary | ICD-10-CM | POA: Diagnosis not present

## 2022-12-14 DIAGNOSIS — H04123 Dry eye syndrome of bilateral lacrimal glands: Secondary | ICD-10-CM | POA: Diagnosis not present

## 2022-12-20 DIAGNOSIS — M9903 Segmental and somatic dysfunction of lumbar region: Secondary | ICD-10-CM | POA: Diagnosis not present

## 2022-12-20 DIAGNOSIS — M5386 Other specified dorsopathies, lumbar region: Secondary | ICD-10-CM | POA: Diagnosis not present

## 2022-12-20 DIAGNOSIS — M9902 Segmental and somatic dysfunction of thoracic region: Secondary | ICD-10-CM | POA: Diagnosis not present

## 2022-12-20 DIAGNOSIS — M9901 Segmental and somatic dysfunction of cervical region: Secondary | ICD-10-CM | POA: Diagnosis not present

## 2022-12-20 DIAGNOSIS — M9904 Segmental and somatic dysfunction of sacral region: Secondary | ICD-10-CM | POA: Diagnosis not present

## 2022-12-27 DIAGNOSIS — M9901 Segmental and somatic dysfunction of cervical region: Secondary | ICD-10-CM | POA: Diagnosis not present

## 2022-12-27 DIAGNOSIS — M9903 Segmental and somatic dysfunction of lumbar region: Secondary | ICD-10-CM | POA: Diagnosis not present

## 2022-12-27 DIAGNOSIS — M9902 Segmental and somatic dysfunction of thoracic region: Secondary | ICD-10-CM | POA: Diagnosis not present

## 2022-12-27 DIAGNOSIS — M9904 Segmental and somatic dysfunction of sacral region: Secondary | ICD-10-CM | POA: Diagnosis not present

## 2022-12-27 DIAGNOSIS — M5386 Other specified dorsopathies, lumbar region: Secondary | ICD-10-CM | POA: Diagnosis not present

## 2023-01-11 DIAGNOSIS — M5386 Other specified dorsopathies, lumbar region: Secondary | ICD-10-CM | POA: Diagnosis not present

## 2023-01-11 DIAGNOSIS — M9904 Segmental and somatic dysfunction of sacral region: Secondary | ICD-10-CM | POA: Diagnosis not present

## 2023-01-11 DIAGNOSIS — M9901 Segmental and somatic dysfunction of cervical region: Secondary | ICD-10-CM | POA: Diagnosis not present

## 2023-01-11 DIAGNOSIS — M9902 Segmental and somatic dysfunction of thoracic region: Secondary | ICD-10-CM | POA: Diagnosis not present

## 2023-01-11 DIAGNOSIS — M9903 Segmental and somatic dysfunction of lumbar region: Secondary | ICD-10-CM | POA: Diagnosis not present

## 2023-01-31 DIAGNOSIS — M9904 Segmental and somatic dysfunction of sacral region: Secondary | ICD-10-CM | POA: Diagnosis not present

## 2023-01-31 DIAGNOSIS — M9902 Segmental and somatic dysfunction of thoracic region: Secondary | ICD-10-CM | POA: Diagnosis not present

## 2023-01-31 DIAGNOSIS — M9903 Segmental and somatic dysfunction of lumbar region: Secondary | ICD-10-CM | POA: Diagnosis not present

## 2023-01-31 DIAGNOSIS — M5386 Other specified dorsopathies, lumbar region: Secondary | ICD-10-CM | POA: Diagnosis not present

## 2023-01-31 DIAGNOSIS — M9901 Segmental and somatic dysfunction of cervical region: Secondary | ICD-10-CM | POA: Diagnosis not present

## 2023-02-01 DIAGNOSIS — Z6830 Body mass index (BMI) 30.0-30.9, adult: Secondary | ICD-10-CM | POA: Diagnosis not present

## 2023-02-01 DIAGNOSIS — J09X2 Influenza due to identified novel influenza A virus with other respiratory manifestations: Secondary | ICD-10-CM | POA: Diagnosis not present

## 2023-02-01 DIAGNOSIS — J029 Acute pharyngitis, unspecified: Secondary | ICD-10-CM | POA: Diagnosis not present

## 2023-02-14 DIAGNOSIS — M9903 Segmental and somatic dysfunction of lumbar region: Secondary | ICD-10-CM | POA: Diagnosis not present

## 2023-02-14 DIAGNOSIS — M5386 Other specified dorsopathies, lumbar region: Secondary | ICD-10-CM | POA: Diagnosis not present

## 2023-02-14 DIAGNOSIS — M9901 Segmental and somatic dysfunction of cervical region: Secondary | ICD-10-CM | POA: Diagnosis not present

## 2023-02-14 DIAGNOSIS — M9904 Segmental and somatic dysfunction of sacral region: Secondary | ICD-10-CM | POA: Diagnosis not present

## 2023-02-14 DIAGNOSIS — M9902 Segmental and somatic dysfunction of thoracic region: Secondary | ICD-10-CM | POA: Diagnosis not present

## 2023-02-21 DIAGNOSIS — M9901 Segmental and somatic dysfunction of cervical region: Secondary | ICD-10-CM | POA: Diagnosis not present

## 2023-02-21 DIAGNOSIS — M9903 Segmental and somatic dysfunction of lumbar region: Secondary | ICD-10-CM | POA: Diagnosis not present

## 2023-02-21 DIAGNOSIS — Z6830 Body mass index (BMI) 30.0-30.9, adult: Secondary | ICD-10-CM | POA: Diagnosis not present

## 2023-02-21 DIAGNOSIS — M9902 Segmental and somatic dysfunction of thoracic region: Secondary | ICD-10-CM | POA: Diagnosis not present

## 2023-02-21 DIAGNOSIS — M9904 Segmental and somatic dysfunction of sacral region: Secondary | ICD-10-CM | POA: Diagnosis not present

## 2023-02-21 DIAGNOSIS — M5386 Other specified dorsopathies, lumbar region: Secondary | ICD-10-CM | POA: Diagnosis not present

## 2023-02-21 DIAGNOSIS — I10 Essential (primary) hypertension: Secondary | ICD-10-CM | POA: Diagnosis not present

## 2023-02-21 DIAGNOSIS — J018 Other acute sinusitis: Secondary | ICD-10-CM | POA: Diagnosis not present

## 2023-03-14 DIAGNOSIS — M5386 Other specified dorsopathies, lumbar region: Secondary | ICD-10-CM | POA: Diagnosis not present

## 2023-03-14 DIAGNOSIS — M9901 Segmental and somatic dysfunction of cervical region: Secondary | ICD-10-CM | POA: Diagnosis not present

## 2023-03-14 DIAGNOSIS — M9904 Segmental and somatic dysfunction of sacral region: Secondary | ICD-10-CM | POA: Diagnosis not present

## 2023-03-14 DIAGNOSIS — M9902 Segmental and somatic dysfunction of thoracic region: Secondary | ICD-10-CM | POA: Diagnosis not present

## 2023-03-14 DIAGNOSIS — M9903 Segmental and somatic dysfunction of lumbar region: Secondary | ICD-10-CM | POA: Diagnosis not present

## 2023-03-28 DIAGNOSIS — M9902 Segmental and somatic dysfunction of thoracic region: Secondary | ICD-10-CM | POA: Diagnosis not present

## 2023-03-28 DIAGNOSIS — M9901 Segmental and somatic dysfunction of cervical region: Secondary | ICD-10-CM | POA: Diagnosis not present

## 2023-03-28 DIAGNOSIS — M9903 Segmental and somatic dysfunction of lumbar region: Secondary | ICD-10-CM | POA: Diagnosis not present

## 2023-03-28 DIAGNOSIS — M5386 Other specified dorsopathies, lumbar region: Secondary | ICD-10-CM | POA: Diagnosis not present

## 2023-03-28 DIAGNOSIS — M9904 Segmental and somatic dysfunction of sacral region: Secondary | ICD-10-CM | POA: Diagnosis not present

## 2023-04-11 DIAGNOSIS — M9902 Segmental and somatic dysfunction of thoracic region: Secondary | ICD-10-CM | POA: Diagnosis not present

## 2023-04-11 DIAGNOSIS — M9903 Segmental and somatic dysfunction of lumbar region: Secondary | ICD-10-CM | POA: Diagnosis not present

## 2023-04-11 DIAGNOSIS — M5386 Other specified dorsopathies, lumbar region: Secondary | ICD-10-CM | POA: Diagnosis not present

## 2023-04-11 DIAGNOSIS — M9901 Segmental and somatic dysfunction of cervical region: Secondary | ICD-10-CM | POA: Diagnosis not present

## 2023-04-11 DIAGNOSIS — M9904 Segmental and somatic dysfunction of sacral region: Secondary | ICD-10-CM | POA: Diagnosis not present

## 2023-05-03 DIAGNOSIS — M9901 Segmental and somatic dysfunction of cervical region: Secondary | ICD-10-CM | POA: Diagnosis not present

## 2023-05-03 DIAGNOSIS — M9902 Segmental and somatic dysfunction of thoracic region: Secondary | ICD-10-CM | POA: Diagnosis not present

## 2023-05-03 DIAGNOSIS — M5386 Other specified dorsopathies, lumbar region: Secondary | ICD-10-CM | POA: Diagnosis not present

## 2023-05-03 DIAGNOSIS — M9904 Segmental and somatic dysfunction of sacral region: Secondary | ICD-10-CM | POA: Diagnosis not present

## 2023-05-03 DIAGNOSIS — M9903 Segmental and somatic dysfunction of lumbar region: Secondary | ICD-10-CM | POA: Diagnosis not present

## 2023-05-16 DIAGNOSIS — M9901 Segmental and somatic dysfunction of cervical region: Secondary | ICD-10-CM | POA: Diagnosis not present

## 2023-05-16 DIAGNOSIS — M9902 Segmental and somatic dysfunction of thoracic region: Secondary | ICD-10-CM | POA: Diagnosis not present

## 2023-05-16 DIAGNOSIS — M9904 Segmental and somatic dysfunction of sacral region: Secondary | ICD-10-CM | POA: Diagnosis not present

## 2023-05-16 DIAGNOSIS — M9903 Segmental and somatic dysfunction of lumbar region: Secondary | ICD-10-CM | POA: Diagnosis not present

## 2023-05-16 DIAGNOSIS — M5386 Other specified dorsopathies, lumbar region: Secondary | ICD-10-CM | POA: Diagnosis not present

## 2023-05-20 ENCOUNTER — Other Ambulatory Visit: Payer: Self-pay | Admitting: Medical

## 2023-05-30 ENCOUNTER — Ambulatory Visit (INDEPENDENT_AMBULATORY_CARE_PROVIDER_SITE_OTHER): Payer: Medicare Other | Admitting: *Deleted

## 2023-05-30 VITALS — BP 150/78 | HR 91 | Ht 73.0 in | Wt 246.8 lb

## 2023-05-30 DIAGNOSIS — Z0000 Encounter for general adult medical examination without abnormal findings: Secondary | ICD-10-CM | POA: Diagnosis not present

## 2023-05-30 NOTE — Patient Instructions (Signed)
Gregory Tate , Thank you for taking time to come for your Medicare Wellness Visit. I appreciate your ongoing commitment to your health goals. Please review the following plan we discussed and let me know if I can assist you in the future.   This is a list of the screening recommended for you and due dates:  Health Maintenance  Topic Date Due   Zoster (Shingles) Vaccine (1 of 2) Never done   COVID-19 Vaccine (4 - 2023-24 season) 08/05/2022   Flu Shot  07/06/2023   Medicare Annual Wellness Visit  05/29/2024   DTaP/Tdap/Td vaccine (3 - Td or Tdap) 01/02/2027   Colon Cancer Screening  01/17/2027   Pneumonia Vaccine  Completed   Hepatitis C Screening  Completed   HPV Vaccine  Aged Out    Next appointment: Follow up in one year for your annual wellness visit.   Preventive Care 15 Years and Older, Male Preventive care refers to lifestyle choices and visits with your health care provider that can promote health and wellness. What does preventive care include? A yearly physical exam. This is also called an annual well check. Dental exams once or twice a year. Routine eye exams. Ask your health care provider how often you should have your eyes checked. Personal lifestyle choices, including: Daily care of your teeth and gums. Regular physical activity. Eating a healthy diet. Avoiding tobacco and drug use. Limiting alcohol use. Practicing safe sex. Taking low doses of aspirin every day. Taking vitamin and mineral supplements as recommended by your health care provider. What happens during an annual well check? The services and screenings done by your health care provider during your annual well check will depend on your age, overall health, lifestyle risk factors, and family history of disease. Counseling  Your health care provider may ask you questions about your: Alcohol use. Tobacco use. Drug use. Emotional well-being. Home and relationship well-being. Sexual activity. Eating  habits. History of falls. Memory and ability to understand (cognition). Work and work Astronomer. Screening  You may have the following tests or measurements: Height, weight, and BMI. Blood pressure. Lipid and cholesterol levels. These may be checked every 5 years, or more frequently if you are over 57 years old. Skin check. Lung cancer screening. You may have this screening every year starting at age 78 if you have a 30-pack-year history of smoking and currently smoke or have quit within the past 15 years. Fecal occult blood test (FOBT) of the stool. You may have this test every year starting at age 36. Flexible sigmoidoscopy or colonoscopy. You may have a sigmoidoscopy every 5 years or a colonoscopy every 10 years starting at age 59. Prostate cancer screening. Recommendations will vary depending on your family history and other risks. Hepatitis C blood test. Hepatitis B blood test. Sexually transmitted disease (STD) testing. Diabetes screening. This is done by checking your blood sugar (glucose) after you have not eaten for a while (fasting). You may have this done every 1-3 years. Abdominal aortic aneurysm (AAA) screening. You may need this if you are a current or former smoker. Osteoporosis. You may be screened starting at age 63 if you are at high risk. Talk with your health care provider about your test results, treatment options, and if necessary, the need for more tests. Vaccines  Your health care provider may recommend certain vaccines, such as: Influenza vaccine. This is recommended every year. Tetanus, diphtheria, and acellular pertussis (Tdap, Td) vaccine. You may need a Td booster every 10  years. Zoster vaccine. You may need this after age 69. Pneumococcal 13-valent conjugate (PCV13) vaccine. One dose is recommended after age 76. Pneumococcal polysaccharide (PPSV23) vaccine. One dose is recommended after age 18. Talk to your health care provider about which screenings and  vaccines you need and how often you need them. This information is not intended to replace advice given to you by your health care provider. Make sure you discuss any questions you have with your health care provider. Document Released: 12/18/2015 Document Revised: 08/10/2016 Document Reviewed: 09/22/2015 Elsevier Interactive Patient Education  2017 [place] Prevention in the Home Falls can cause injuries. They can happen to people of all ages. There are many things you can do to make your home safe and to help prevent falls. What can I do on the outside of my home? Regularly fix the edges of walkways and driveways and fix any cracks. Remove anything that might make you trip as you walk through a door, such as a raised step or threshold. Trim any bushes or trees on the path to your home. Use bright outdoor lighting. Clear any walking paths of anything that might make someone trip, such as rocks or tools. Regularly check to see if handrails are loose or broken. Make sure that both sides of any steps have handrails. Any raised decks and porches should have guardrails on the edges. Have any leaves, snow, or ice cleared regularly. Use sand or salt on walking paths during winter. Clean up any spills in your garage right away. This includes oil or grease spills. What can I do in the bathroom? Use night lights. Install grab bars by the toilet and in the tub and shower. Do not use towel bars as grab bars. Use non-skid mats or decals in the tub or shower. If you need to sit down in the shower, use a plastic, non-slip stool. Keep the floor dry. Clean up any water that spills on the floor as soon as it happens. Remove soap buildup in the tub or shower regularly. Attach bath mats securely with double-sided non-slip rug tape. Do not have throw rugs and other things on the floor that can make you trip. What can I do in the bedroom? Use night lights. Make sure that you have a light by your  bed that is easy to reach. Do not use any sheets or blankets that are too big for your bed. They should not hang down onto the floor. Have a firm chair that has side arms. You can use this for support while you get dressed. Do not have throw rugs and other things on the floor that can make you trip. What can I do in the kitchen? Clean up any spills right away. Avoid walking on wet floors. Keep items that you use a lot in easy-to-reach places. If you need to reach something above you, use a strong step stool that has a grab bar. Keep electrical cords out of the way. Do not use floor polish or wax that makes floors slippery. If you must use wax, use non-skid floor wax. Do not have throw rugs and other things on the floor that can make you trip. What can I do with my stairs? Do not leave any items on the stairs. Make sure that there are handrails on both sides of the stairs and use them. Fix handrails that are broken or loose. Make sure that handrails are as long as the stairways. Check any carpeting to make sure  that it is firmly attached to the stairs. Fix any carpet that is loose or worn. Avoid having throw rugs at the top or bottom of the stairs. If you do have throw rugs, attach them to the floor with carpet tape. Make sure that you have a light switch at the top of the stairs and the bottom of the stairs. If you do not have them, ask someone to add them for you. What else can I do to help prevent falls? Wear shoes that: Do not have high heels. Have rubber bottoms. Are comfortable and fit you well. Are closed at the toe. Do not wear sandals. If you use a stepladder: Make sure that it is fully opened. Do not climb a closed stepladder. Make sure that both sides of the stepladder are locked into place. Ask someone to hold it for you, if possible. Clearly mark and make sure that you can see: Any grab bars or handrails. First and last steps. Where the edge of each step is. Use tools that  help you move around (mobility aids) if they are needed. These include: Canes. Walkers. Scooters. Crutches. Turn on the lights when you go into a dark area. Replace any light bulbs as soon as they burn out. Set up your furniture so you have a clear path. Avoid moving your furniture around. If any of your floors are uneven, fix them. If there are any pets around you, be aware of where they are. Review your medicines with your doctor. Some medicines can make you feel dizzy. This can increase your chance of falling. Ask your doctor what other things that you can do to help prevent falls. This information is not intended to replace advice given to you by your health care provider. Make sure you discuss any questions you have with your health care provider. Document Released: 09/17/2009 Document Revised: 04/28/2016 Document Reviewed: 12/26/2014 Elsevier Interactive Patient Education  2017 [employer].

## 2023-05-30 NOTE — Progress Notes (Signed)
Subjective:   Gregory Tate is a 72 y.o. male who presents for Medicare Annual/Subsequent preventive examination.  Visit Complete: In person  Patient Medicare AWV questionnaire was completed by the patient on 05/23/23; I have confirmed that all information answered by patient is correct and no changes since this date.  Review of Systems     Cardiac Risk Factors include: advanced age (>47men, >49 women);male gender;dyslipidemia;hypertension     Objective:    Today's Vitals   05/30/23 1026 05/30/23 1054  BP: (!) 158/85 (!) 150/78  Pulse: 88 91  Weight: 246 lb 12.8 oz (111.9 kg)   Height: 6\' 1"  (1.854 m)    Body mass index is 32.56 kg/m.     05/30/2023   10:31 AM 05/24/2022   10:05 AM 05/18/2021   11:39 AM 04/02/2020   10:11 AM 04/02/2019    9:18 AM 03/29/2018   10:13 AM  Advanced Directives  Does Patient Have a Medical Advance Directive? Yes Yes Yes Yes Yes Yes  Type of Advance Directive Living will Healthcare Power of [place];Out of facility DNR (pink MOST or yellow form);Living will Healthcare Power of [place];Living will Healthcare Power of [place];Living will Healthcare Power of [place];Living will Healthcare Power of [place];Living will  Does patient want to make changes to medical advance directive? No - Patient declined No - Patient declined  No - Patient declined No - Patient declined No - Patient declined  Copy of Healthcare Power of Attorney in Chart?  No - copy requested No - copy requested No - copy requested No - copy requested No - copy requested    Current Medications (verified) Outpatient Encounter Medications as of 05/30/2023  Medication Sig   aspirin 81 MG tablet Take 81 mg by mouth daily.     Cholecalciferol (VITAMIN D3) 1000 UNITS tablet Take 1,000 Units by mouth daily.     cyclobenzaprine (FLEXERIL) 5 MG tablet Take 1 tablet (5 mg total) by mouth at bedtime.   doxycycline (VIBRAMYCIN) 100 MG capsule Take 1 capsule (100 mg total) by mouth 2 (two) times  daily.   lisinopril-hydrochlorothiazide (ZESTORETIC) 20-25 MG tablet Take 1 tablet by mouth daily.   NON FORMULARY Cherry Extract as needed for Gout   simvastatin (ZOCOR) 40 MG tablet Take 1 tablet (40 mg total) by mouth at bedtime.   [DISCONTINUED] albuterol (VENTOLIN HFA) 108 (90 Base) MCG/ACT inhaler Inhale 2 puffs into the lungs every 6 (six) hours as needed for wheezing or shortness of breath.   [DISCONTINUED] mupirocin ointment (BACTROBAN) 2 % Apply thin film twice daily   No facility-administered encounter medications on file as of 05/30/2023.    Allergies (verified) Patient has no known allergies.   History: 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 NO MX   Hyperlipidemia    Hypertension    Mumps    Shingles 08/05/2017   Past Surgical History:  Procedure Laterality Date   COLONOSCOPY  2007   TONSILLECTOMY     URINARY DIVERSION  2002   radical cystoprostatecomy and urinary diversion    Family History  Problem Relation Age of Onset   Coronary artery disease Father 64       Deceased   Hypertension Father    Alzheimer's disease Father  Hypertension Mother        Living   Diabetes Brother        borderline #1   Stroke Paternal Grandfather    Heart attack Maternal Grandfather    Arthritis Other    Hypertension Brother        #2   Heart disease Brother        #2   Healthy Son        x1   Social History   Socioeconomic History   Marital status: Married    Spouse name: Not on file   Number of children: Not on file   Years of education: Not on file   Highest education level: Not on file  Occupational History   Not on file  Tobacco Use   Smoking status: Never   Smokeless tobacco: Never  Substance and Sexual Activity   Alcohol use: Yes     Alcohol/week: 3.0 standard drinks of alcohol    Types: 3 Cans of beer per week   Drug use: No   Sexual activity: Not Currently  Other Topics Concern   Not on file  Social History Narrative   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   Social Determinants of Health   Financial Resource Strain: Low Risk  (05/23/2023)   Overall Financial Resource Strain (CARDIA)    Difficulty of Paying Living Expenses: Not hard at all  Food Insecurity: No Food Insecurity (05/23/2023)   Hunger Vital Sign    Worried About Running Out of Food in the Last Year: Never true    Ran Out of Food in the Last Year: Never true  Transportation Needs: No Transportation Needs (05/23/2023)   PRAPARE - Administrator, Civil Service (Medical): No    Lack of Transportation (Non-Medical): No  Physical Activity: Sufficiently Active (05/23/2023)   Exercise Vital Sign    Days of Exercise per Week: 6 days    Minutes of Exercise per Session: 30 min  Stress: Stress Concern Present (05/23/2023)   [employer] of Occupational Health - Occupational Stress Questionnaire    Feeling of Stress : To some extent  Social Connections: Unknown (05/23/2023)   Social Connection and Isolation Panel [NHANES]    Frequency of Communication with Friends and Family: More than three times a week    Frequency of Social Gatherings with Friends and Family: Once a week    Attends Religious Services: Not on Marketing executive or Organizations: No    Attends Engineer, structural: Patient declined    Marital Status: Married    Tobacco Counseling Counseling given: Not Answered   Clinical Intake:  Pre-visit preparation completed: Yes  Pain : No/denies pain  Nutritional Risks: None Diabetes: No  How often do you need to have someone help you  when you read instructions, pamphlets, or other written materials from your doctor or pharmacy?: 1 - Never  Interpreter Needed?: No  Information entered by :: [employer], CMA   Activities of Daily Living    05/23/2023    8:59 AM  In your present state of health, do you have any difficulty performing the following activities:  Hearing? 0  Vision? 0  Difficulty concentrating or making  decisions? 0  Walking or climbing stairs? 0  Dressing or bathing? 0  Doing errands, shopping? 0  Preparing Food and eating ? N  Using the Toilet? N  In the past six months, have you accidently leaked urine? Y  Do you have problems with loss of bowel control? N  Managing your Medications? N  Managing your Finances? N  Housekeeping or managing your Housekeeping? N    Patient Care Team: Saguier, Kateri Mc as PCP - General (Internal Medicine) Marcine Matar, MD as Consulting Physician (Urology) Robert Bellow, MD as Referring Physician (Dermatology)  Indicate any recent Medical Services you may have received from other than [facility] providers in the past year (date may be approximate).     Assessment:   This is a routine wellness examination for [place].  Hearing/Vision screen No results found.  Dietary issues and exercise activities discussed:     Goals Addressed   None    Depression Screen    05/30/2023   10:34 AM 05/18/2021   11:42 AM 04/02/2020   10:14 AM 04/02/2019    9:20 AM 03/29/2018   10:14 AM 01/15/2016    8:11 AM  PHQ 2/9 Scores  PHQ - 2 Score 0 0 0 0 0 0    Fall Risk    05/23/2023    8:59 AM 05/24/2022   10:06 AM 05/18/2021   11:42 AM 04/02/2020   10:14 AM 04/02/2019    9:20 AM  Fall Risk   Falls in the past year? 0 0 0 1 0  Number falls in past yr: 0 0 0 0   Injury with Fall? 0 0 0 1   Risk for fall due to : No Fall Risks History of fall(s)     Follow up Falls evaluation completed Falls evaluation completed Falls prevention discussed Education provided;Falls  prevention discussed     MEDICARE RISK AT HOME:   TIMED UP AND GO:  Was the test performed?  Yes  Length of time to ambulate 10 feet: 6 sec Gait steady and fast without use of assistive device    Cognitive Function:        05/30/2023   10:52 AM 05/24/2022   10:16 AM  6CIT Screen  What Year? 0 points 0 points  What month? 0 points 0 points  What time? 0 points 0 points  Count back from 20 0 points 0 points  Months in reverse 0 points 0 points  Repeat phrase 0 points 2 points  Total Score 0 points 2 points    Immunizations Immunization History  Administered Date(s) Administered   Influenza Whole 11/30/2007, 08/28/2009   Influenza-Unspecified 04/05/2015   PFIZER(Purple Top)SARS-COV-2 Vaccination 01/02/2020, 01/30/2020, 02/01/2021   Pneumococcal Conjugate-13 02/24/2015   Pneumococcal Polysaccharide-23 01/02/2017   Td 05/22/2006   Tdap 01/02/2017    TDAP status: Up to date  Flu Vaccine status: Up to date  Pneumococcal vaccine status: Up to date  Covid-19 vaccine status: Information provided on how to obtain vaccines.   Qualifies for Shingles Vaccine? Yes   Zostavax completed No   Shingrix Completed?: No.    Education has been provided regarding the importance of this vaccine. Patient has been advised to call insurance company to determine out of pocket expense if they have not yet received this vaccine. Advised may also receive vaccine at local pharmacy or Health Dept. Verbalized acceptance and understanding.  Screening Tests Health Maintenance  Topic Date Due   Zoster Vaccines- Shingrix (1 of 2) Never done  COVID-19 Vaccine (4 - 2023-24 season) 08/05/2022   Medicare Annual Wellness (AWV)  05/25/2023   INFLUENZA VACCINE  07/06/2023   DTaP/Tdap/Td (3 - Td or Tdap) 01/02/2027   Colonoscopy  01/17/2027   Pneumonia Vaccine 15+ Years old  Completed   Hepatitis C Screening  Completed   HPV VACCINES  Aged Out    Health Maintenance  Health Maintenance Due   Topic Date Due   Zoster Vaccines- Shingrix (1 of 2) Never done   COVID-19 Vaccine (4 - 2023-24 season) 08/05/2022   Medicare Annual Wellness (AWV)  05/25/2023    Colorectal cancer screening: Type of screening: Colonoscopy. Completed 01/17/17. Repeat every 10 years  Lung Cancer Screening: (Low Dose CT Chest recommended if Age 69-80 years, 20 pack-year currently smoking OR have quit w/in 15years.) does not qualify.   Additional Screening:  Hepatitis C Screening: does qualify; Completed 1/296/18  Vision Screening: Recommended annual ophthalmology exams for early detection of glaucoma and other disorders of the eye. Is the patient up to date with their annual eye exam?  Yes  Who is the provider or what is the name of the office in which the patient attends annual eye exams? [facility] If pt is not established with a provider, would they like to be referred to a provider to establish care? No .   Dental Screening: Recommended annual dental exams for proper oral hygiene  Diabetic Foot Exam: N/a  Community Resource Referral / Chronic Care Management: CRR required this visit?  No   CCM required this visit?  No     Plan:     I have personally reviewed and noted the following in the patient's chart:   Medical and social history Use of alcohol, tobacco or illicit drugs  Current medications and supplements including opioid prescriptions. Patient is not currently taking opioid prescriptions. Functional ability and status Nutritional status Physical activity Advanced directives List of other physicians Hospitalizations, surgeries, and ER visits in previous 12 months Vitals Screenings to include cognitive, depression, and falls Referrals and appointments  In addition, I have reviewed and discussed with patient certain preventive protocols, quality metrics, and best practice recommendations. A written personalized care plan for preventive services as well as general preventive health  recommendations were provided to patient.     Donne Anon, CMA   05/30/2023   After Visit Summary: Sent to mychart  Nurse Notes: None

## 2023-05-31 DIAGNOSIS — M9902 Segmental and somatic dysfunction of thoracic region: Secondary | ICD-10-CM | POA: Diagnosis not present

## 2023-05-31 DIAGNOSIS — M9901 Segmental and somatic dysfunction of cervical region: Secondary | ICD-10-CM | POA: Diagnosis not present

## 2023-05-31 DIAGNOSIS — M9903 Segmental and somatic dysfunction of lumbar region: Secondary | ICD-10-CM | POA: Diagnosis not present

## 2023-05-31 DIAGNOSIS — M9904 Segmental and somatic dysfunction of sacral region: Secondary | ICD-10-CM | POA: Diagnosis not present

## 2023-05-31 DIAGNOSIS — M5386 Other specified dorsopathies, lumbar region: Secondary | ICD-10-CM | POA: Diagnosis not present

## 2023-06-12 ENCOUNTER — Ambulatory Visit (INDEPENDENT_AMBULATORY_CARE_PROVIDER_SITE_OTHER): Payer: Medicare Other | Admitting: Medical

## 2023-06-12 ENCOUNTER — Encounter: Payer: Self-pay | Admitting: Medical

## 2023-06-12 ENCOUNTER — Ambulatory Visit (HOSPITAL_BASED_OUTPATIENT_CLINIC_OR_DEPARTMENT_OTHER)
Admission: RE | Admit: 2023-06-12 | Discharge: 2023-06-12 | Disposition: A | Discharge status: Home / Self Care | Payer: Medicare Other | Source: Ambulatory Visit | Attending: Medical | Admitting: Medical

## 2023-06-12 VITALS — BP 135/75 | HR 98 | Temp 97.7°F | Resp 16 | Ht 73.0 in | Wt 248.8 lb

## 2023-06-12 DIAGNOSIS — M25552 Pain in left hip: Secondary | ICD-10-CM | POA: Diagnosis not present

## 2023-06-12 DIAGNOSIS — Z7984 Long term (current) use of oral hypoglycemic drugs: Secondary | ICD-10-CM | POA: Diagnosis not present

## 2023-06-12 DIAGNOSIS — M47816 Spondylosis without myelopathy or radiculopathy, lumbar region: Secondary | ICD-10-CM | POA: Diagnosis not present

## 2023-06-12 DIAGNOSIS — R252 Cramp and spasm: Secondary | ICD-10-CM | POA: Diagnosis not present

## 2023-06-12 DIAGNOSIS — E119 Type 2 diabetes mellitus without complications: Secondary | ICD-10-CM

## 2023-06-12 DIAGNOSIS — E785 Hyperlipidemia, unspecified: Secondary | ICD-10-CM | POA: Diagnosis not present

## 2023-06-12 DIAGNOSIS — B029 Zoster without complications: Secondary | ICD-10-CM | POA: Diagnosis not present

## 2023-06-12 MED ORDER — FAMCICLOVIR 500 MG PO TABS: 500.0000 mg | ORAL_TABLET | Freq: Three times a day (TID) | ORAL | 0 refills | Status: DC

## 2023-06-12 NOTE — Patient Instructions (Addendum)
1. Hyperlipidemia, unspecified hyperlipidemia type Continue simvastatin. Consider dose change if needed or maybe switch. - Comp Met (CMET) - Lipid panel  2. Diabetes mellitus without complication (HCC) Low sugar diet and exercise. Follow average sugar.  - Urine Microalbumin w/creat. ratio - Comp Met (CMET)  3. Pain of left hip -can use tylenol and alleve(if needed). Rx advisement. - DG HIP UNILAT WITH PELVIS 2-3 VIEWS LEFT; Future  4. Herpes zoster without complication Rx famvir. With your prior shingles in this location on back, recent possible blister lesion and nerve like pain due recommend use famvir for 7 days. Then in 4-6 month if no active lesion/break out get shingrix.  5. Cramp and spasm - Magnesium   6 Htn- bp well controlled.  Follow up date to be determined after lab review.

## 2023-06-12 NOTE — Progress Notes (Signed)
Subjective:    Patient ID: Gregory Tate, male    DOB: 16-May-1951, 72 y.o.   MRN: [id number]  HPI Pt in for follow up.   He has not been seen in 2 years.  Pt states he has hx of shingles. He states had that years ago. He states area on left side of back is still sensitive intermittently. Occasionally he will scrape of some skin since it does itch at times. Pt states when he had shingles was treated by ED. Not by our office.  No hx of shingrix.  He has some frequent urination. He has a neobladder. Removed bladder 22 years ago. He has not seen urologist for years. Pt also had prostate removed.  Last A/P  "Your blood pressure is better today on recheck. Continue zestoretic 20-25 1 tab daily.   For high cholesterol continue simvastatin. May need does adjustment if needed.   For elevated sugar eat low sugar diet and get A1c"  Hx of left side sciatica and hip pain. Pt states used blue emu, biofreeze and massage gun.  Review of Systems  Constitutional:  Negative for chills and fatigue.  Respiratory:  Negative for cough, chest tightness, shortness of breath and wheezing.   Cardiovascular:  Negative for chest pain and palpitations.  Gastrointestinal:  Negative for abdominal pain, nausea and vomiting.  Genitourinary:  Negative for dysuria.  Musculoskeletal:  Negative for back pain.       Hip pain.  Skin:  Negative for rash.       Atypical intermittent shingles like pain. See hpi.  Neurological:  Negative for dizziness, speech difficulty and numbness.  Psychiatric/Behavioral:  Negative for behavioral problems and confusion.     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 NO MX   Hyperlipidemia    Hypertension    Mumps    Shingles 08/05/2017     Social History    Socioeconomic History   Marital status: Married    Spouse name: Not on file   Number of children: Not on file   Years of education: Not on file   Highest education level: Bachelor's degree (e.g., BA, AB, BS)  Occupational History   Not on file  Tobacco Use   Smoking status: Never   Smokeless tobacco: Never  Substance and Sexual Activity   Alcohol use: Yes    Alcohol/week: 3.0 standard drinks of alcohol    Types: 3 Cans of beer per week   Drug use: No   Sexual activity: Not Currently  Other Topics Concern   Not on file  Social History Narrative   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   Social Determinants of Health   Financial Resource Strain: Low Risk  (06/08/2023)   Overall Financial Resource Strain (CARDIA)    Difficulty of Paying Living Expenses: Not hard at all  Food Insecurity: No Food Insecurity (06/08/2023)   Hunger Vital Sign    Worried About Running Out of Food in the Last Year: Never true  Ran Out of Food in the Last Year: Never true  Transportation Needs: No Transportation Needs (06/08/2023)   PRAPARE - Administrator, Civil Service (Medical): No    Lack of Transportation (Non-Medical): No  Physical Activity: Sufficiently Active (06/08/2023)   Exercise Vital Sign    Days of Exercise per Week: 6 days    Minutes of Exercise per Session: 40 min  Stress: No Stress Concern Present (06/08/2023)   [employer] of Occupational Health - Occupational Stress Questionnaire    Feeling of Stress : Only a little  Recent Concern: Stress - Stress Concern Present (05/23/2023)   [employer] of Occupational Health - Occupational Stress Questionnaire    Feeling of Stress : To some extent  Social Connections: Unknown (05/23/2023)   Social Connection and Isolation Panel [NHANES]    Frequency  of Communication with Friends and Family: More than three times a week    Frequency of Social Gatherings with Friends and Family: Once a week    Attends Religious Services: Not on Marketing executive or Organizations: No    Attends Banker Meetings: Patient declined    Marital Status: Married  Catering manager Violence: Not At Risk (05/30/2023)   Humiliation, Afraid, Rape, and Kick questionnaire    Fear of Current or Ex-Partner: No    Emotionally Abused: No    Physically Abused: No    Sexually Abused: No    Past Surgical History:  Procedure Laterality Date   COLONOSCOPY  2007   TONSILLECTOMY     URINARY DIVERSION  2002   radical cystoprostatecomy and urinary diversion     Family History  Problem Relation Age of Onset   Coronary artery disease Father 18       Deceased   Hypertension Father    Alzheimer's disease Father    Hypertension Mother        Living   Diabetes Brother        borderline #1   Stroke Paternal Grandfather    Heart attack Maternal Grandfather    Arthritis Other    Hypertension Brother        #2   Heart disease Brother        #2   Healthy Son        x1    No Known Allergies  Current Outpatient Medications on File Prior to Visit  Medication Sig Dispense Refill   aspirin 81 MG tablet Take 81 mg by mouth daily.       Cholecalciferol (VITAMIN D3) 1000 UNITS tablet Take 1,000 Units by mouth daily.       cyclobenzaprine (FLEXERIL) 5 MG tablet Take 1 tablet (5 mg total) by mouth at bedtime. 7 tablet 1   lisinopril-hydrochlorothiazide (ZESTORETIC) 20-25 MG tablet Take 1 tablet by mouth daily. 30 tablet 0   NON FORMULARY Cherry Extract as needed for Gout     simvastatin (ZOCOR) 40 MG tablet Take 1 tablet (40 mg total) by mouth at bedtime. 30 tablet 0   doxycycline (VIBRAMYCIN) 100 MG capsule Take 1 capsule (100 mg total) by mouth 2 (two) times daily. 20 capsule 0   No current facility-administered medications on file prior to visit.     BP 135/75   Pulse 98   Temp 97.7 F (36.5 C) (Oral)   Resp 16   Ht 6\' 1"  (1.854 m)   Wt 248 lb 12.8 oz (112.9 kg)   SpO2 97%   BMI 32.83  kg/m        Objective:   Physical Exam  General Mental Status- Alert. General Appearance- Not in acute distress.   Skin Left side upper back. Pin point small break down in epidermis possible were recent vesicle may have accompanied early neuropathic shingles pain  Neck Carotid Arteries- Normal color. Moisture- Normal Moisture. No carotid bruits. No JVD.  Chest and Lung Exam Auscultation: Breath Sounds:-Normal.  Cardiovascular Auscultation:Rythm- Regular. Murmurs & Other Heart Sounds:Auscultation of the heart reveals- No Murmurs.  Abdomen Inspection:-Inspeection Normal. Palpation/Percussion:Note:No mass. Palpation and Percussion of the abdomen reveal- Non Tender, Non Distended + BS, no rebound or guarding.   Neurologic Cranial Nerve exam:- CN III-XII intact(No nystagmus), symmetric smile. Strength:- 5/5 equal and symmetric strength both upper and lower extremities.        Assessment & Plan:   1. Hyperlipidemia, unspecified hyperlipidemia type Continue simvastatin. Consider dose change if needed or maybe switch. - Comp Met (CMET) - Lipid panel  2. Diabetes mellitus without complication (HCC) Low sugar diet and exercise. Follow average sugar.  - Urine Microalbumin w/creat. ratio - Comp Met (CMET)  3. Pain of left hip -can use tylenol and alleve(if needed). Rx advisement. - DG HIP UNILAT WITH PELVIS 2-3 VIEWS LEFT; Future  4. Herpes zoster without complication Rx famvir. With your prior shingles in this location on back, recent possible blister lesion and nerve like pain due recommend use famvir for 7 days. Then in 4-6 month if no active lesion/break out get shingrix.  5. Cramp and spasm - Magnesium   6 Htn- bp well controlled.  Follow up date to be determined after lab review.    Esperanza Richters, PA-C

## 2023-06-13 LAB — LIPID PANEL
Cholesterol: 151 mg/dL (ref 0–200)
HDL: 50.4 mg/dL (ref >39.00)
NonHDL: 100.7
Total CHOL/HDL Ratio: 3
Triglycerides: 202 mg/dL — ABNORMAL HIGH (ref 0.0–149.0)
VLDL: 40.4 mg/dL — ABNORMAL HIGH (ref 0.0–40.0)

## 2023-06-13 LAB — COMPREHENSIVE METABOLIC PANEL
ALT: 23 U/L (ref 0–53)
AST: 19 U/L (ref 0–37)
Albumin: 4.2 g/dL (ref 3.5–5.2)
Alkaline Phosphatase: 37 U/L — ABNORMAL LOW (ref 39–117)
BUN: 26 mg/dL — ABNORMAL HIGH (ref 6–23)
CO2: 30 mEq/L (ref 19–32)
Calcium: 9.5 mg/dL (ref 8.4–10.5)
Chloride: 102 mEq/L (ref 96–112)
Creatinine, Ser: 1.34 mg/dL (ref 0.40–1.50)
GFR: 52.59 mL/min — ABNORMAL LOW (ref >60.00)
Glucose, Bld: 104 mg/dL — ABNORMAL HIGH (ref 70–99)
Potassium: 4.5 mEq/L (ref 3.5–5.1)
Sodium: 139 mEq/L (ref 135–145)
Total Bilirubin: 0.7 mg/dL (ref 0.2–1.2)
Total Protein: 6.4 g/dL (ref 6.0–8.3)

## 2023-06-13 LAB — MICROALBUMIN / CREATININE URINE RATIO
Creatinine,U: 78.1 mg/dL
Microalb Creat Ratio: 0.9 mg/g (ref 0.0–30.0)
Microalb, Ur: <0.7 mg/dL (ref 0.0–1.9)

## 2023-06-13 LAB — MAGNESIUM: Magnesium: 2.1 mg/dL (ref 1.5–2.5)

## 2023-06-13 LAB — LDL CHOLESTEROL, DIRECT: Direct LDL: 78 mg/dL

## 2023-06-15 NOTE — Addendum Note (Signed)
Addended by: Gwenevere Abbot on: 06/15/2023 03:56 PM   Modules accepted: Orders

## 2023-06-16 ENCOUNTER — Other Ambulatory Visit: Payer: Self-pay | Admitting: Medical

## 2023-06-19 ENCOUNTER — Other Ambulatory Visit (INDEPENDENT_AMBULATORY_CARE_PROVIDER_SITE_OTHER): Payer: Medicare Other

## 2023-06-19 ENCOUNTER — Other Ambulatory Visit: Payer: Self-pay | Admitting: Medical

## 2023-06-19 DIAGNOSIS — E119 Type 2 diabetes mellitus without complications: Secondary | ICD-10-CM

## 2023-06-19 LAB — HEMOGLOBIN A1C: Hgb A1c MFr Bld: 6.7 % — ABNORMAL HIGH (ref 4.6–6.5)

## 2023-06-20 DIAGNOSIS — M9904 Segmental and somatic dysfunction of sacral region: Secondary | ICD-10-CM | POA: Diagnosis not present

## 2023-06-20 DIAGNOSIS — M9901 Segmental and somatic dysfunction of cervical region: Secondary | ICD-10-CM | POA: Diagnosis not present

## 2023-06-20 DIAGNOSIS — M9903 Segmental and somatic dysfunction of lumbar region: Secondary | ICD-10-CM | POA: Diagnosis not present

## 2023-06-20 DIAGNOSIS — M9902 Segmental and somatic dysfunction of thoracic region: Secondary | ICD-10-CM | POA: Diagnosis not present

## 2023-06-20 DIAGNOSIS — M5386 Other specified dorsopathies, lumbar region: Secondary | ICD-10-CM | POA: Diagnosis not present

## 2023-07-03 DIAGNOSIS — M9902 Segmental and somatic dysfunction of thoracic region: Secondary | ICD-10-CM | POA: Diagnosis not present

## 2023-07-03 DIAGNOSIS — M9901 Segmental and somatic dysfunction of cervical region: Secondary | ICD-10-CM | POA: Diagnosis not present

## 2023-07-03 DIAGNOSIS — M5386 Other specified dorsopathies, lumbar region: Secondary | ICD-10-CM | POA: Diagnosis not present

## 2023-07-03 DIAGNOSIS — M9903 Segmental and somatic dysfunction of lumbar region: Secondary | ICD-10-CM | POA: Diagnosis not present

## 2023-07-03 DIAGNOSIS — M9904 Segmental and somatic dysfunction of sacral region: Secondary | ICD-10-CM | POA: Diagnosis not present

## 2023-07-18 DIAGNOSIS — M9901 Segmental and somatic dysfunction of cervical region: Secondary | ICD-10-CM | POA: Diagnosis not present

## 2023-07-18 DIAGNOSIS — M9903 Segmental and somatic dysfunction of lumbar region: Secondary | ICD-10-CM | POA: Diagnosis not present

## 2023-07-18 DIAGNOSIS — M5386 Other specified dorsopathies, lumbar region: Secondary | ICD-10-CM | POA: Diagnosis not present

## 2023-07-18 DIAGNOSIS — M9902 Segmental and somatic dysfunction of thoracic region: Secondary | ICD-10-CM | POA: Diagnosis not present

## 2023-07-18 DIAGNOSIS — M9904 Segmental and somatic dysfunction of sacral region: Secondary | ICD-10-CM | POA: Diagnosis not present

## 2023-07-19 ENCOUNTER — Other Ambulatory Visit: Payer: Self-pay | Admitting: Medical

## 2023-08-02 DIAGNOSIS — M9902 Segmental and somatic dysfunction of thoracic region: Secondary | ICD-10-CM | POA: Diagnosis not present

## 2023-08-02 DIAGNOSIS — M9901 Segmental and somatic dysfunction of cervical region: Secondary | ICD-10-CM | POA: Diagnosis not present

## 2023-08-02 DIAGNOSIS — M9904 Segmental and somatic dysfunction of sacral region: Secondary | ICD-10-CM | POA: Diagnosis not present

## 2023-08-02 DIAGNOSIS — M9903 Segmental and somatic dysfunction of lumbar region: Secondary | ICD-10-CM | POA: Diagnosis not present

## 2023-08-02 DIAGNOSIS — M5386 Other specified dorsopathies, lumbar region: Secondary | ICD-10-CM | POA: Diagnosis not present

## 2023-08-16 ENCOUNTER — Other Ambulatory Visit: Payer: Self-pay | Admitting: Medical

## 2023-08-23 ENCOUNTER — Encounter: Payer: Self-pay | Admitting: Medical

## 2023-08-23 MED ORDER — SIMVASTATIN 40 MG PO TABS: 40.0000 mg | ORAL_TABLET | Freq: Every day | ORAL | 1 refills | Status: DC

## 2023-08-23 MED ORDER — LISINOPRIL-HYDROCHLOROTHIAZIDE 20-25 MG PO TABS: 1.0000 | ORAL_TABLET | Freq: Every day | ORAL | 1 refills | Status: DC

## 2023-09-08 ENCOUNTER — Encounter: Payer: Self-pay | Admitting: Medical

## 2023-09-11 ENCOUNTER — Ambulatory Visit: Payer: Medicare PPO | Admitting: Medical

## 2023-09-13 ENCOUNTER — Encounter: Payer: Self-pay | Admitting: Medical

## 2023-09-18 ENCOUNTER — Ambulatory Visit: Payer: Medicare Other | Admitting: Medical

## 2023-12-07 ENCOUNTER — Other Ambulatory Visit: Payer: Self-pay | Admitting: Family Medicine

## 2024-03-05 ENCOUNTER — Other Ambulatory Visit: Payer: Self-pay | Admitting: Family Medicine

## 2024-04-25 ENCOUNTER — Telehealth: Payer: Self-pay

## 2024-04-25 NOTE — Telephone Encounter (Signed)
 Copied from CRM [id number]. Topic: General - Other >> Apr 25, 2024  9:42 AM Howard Macho wrote: Reason for CRM: julie from [facility] called stating they faxed over a prescription request on 5/12 and 5/21 for lisinopril -hydrochlorothiazide  (ZESTORETIC ) 20-25 MG tablet and simvastatin  (ZOCOR ) 40 MG tablet and they have not received it back yet   CB [phone] Fax [phone]

## 2024-04-26 NOTE — Telephone Encounter (Signed)
 No forms received

## 2024-04-30 ENCOUNTER — Telehealth: Payer: Self-pay | Admitting: Medical

## 2024-04-30 ENCOUNTER — Encounter: Payer: Self-pay | Admitting: Medical

## 2024-04-30 DIAGNOSIS — E119 Type 2 diabetes mellitus without complications: Secondary | ICD-10-CM

## 2024-04-30 DIAGNOSIS — E785 Hyperlipidemia, unspecified: Secondary | ICD-10-CM

## 2024-04-30 MED ORDER — CYCLOBENZAPRINE HCL 5 MG PO TABS: 5.0000 mg | ORAL_TABLET | Freq: Every day | ORAL | 1 refills | Status: DC

## 2024-04-30 NOTE — Telephone Encounter (Signed)
 Pt needs follow up appointment. I just refilled his flexeril . But on review not seen since July of last year and on labs reviewed from last visit had asked him to follow up in 6 months. So he need appointment this month. Please get him scheduled. Let me know that he is scheduled.

## 2024-04-30 NOTE — Telephone Encounter (Signed)
 Pt notified via mychart

## 2024-05-01 MED ORDER — LISINOPRIL-HYDROCHLOROTHIAZIDE 20-25 MG PO TABS: 1.0000 | ORAL_TABLET | Freq: Every day | ORAL | 3 refills | Status: DC

## 2024-05-01 NOTE — Addendum Note (Signed)
 Addended by: Serafina Damme on: 05/01/2024 12:51 PM   Modules accepted: Orders

## 2024-06-04 ENCOUNTER — Encounter: Payer: Self-pay | Admitting: Medical

## 2024-06-04 ENCOUNTER — Telehealth: Payer: Self-pay | Admitting: Medical

## 2024-06-04 NOTE — Telephone Encounter (Signed)
 Copied from CRM [id number]. Topic: Appointments - Scheduling Inquiry for Clinic >> Jun 04, 2024  5:02 PM Winona R wrote: Pt is calling to confirm he can come in tomorrow morning @ 10:30 am for labs.

## 2024-06-04 NOTE — Addendum Note (Signed)
 Addended by: DORINA DALLAS HERO on: 06/04/2024 04:35 PM   Modules accepted: Orders

## 2024-06-04 NOTE — Telephone Encounter (Signed)
 I added him.

## 2024-06-04 NOTE — Telephone Encounter (Signed)
 Pt.notified

## 2024-06-05 ENCOUNTER — Other Ambulatory Visit (INDEPENDENT_AMBULATORY_CARE_PROVIDER_SITE_OTHER)

## 2024-06-05 ENCOUNTER — Ambulatory Visit: Admitting: *Deleted

## 2024-06-05 ENCOUNTER — Telehealth: Payer: Self-pay | Admitting: *Deleted

## 2024-06-05 VITALS — BP 155/81 | HR 91 | Temp 98.2°F | Resp 18 | Ht 73.0 in | Wt 243.2 lb

## 2024-06-05 DIAGNOSIS — E119 Type 2 diabetes mellitus without complications: Secondary | ICD-10-CM | POA: Diagnosis not present

## 2024-06-05 DIAGNOSIS — Z0000 Encounter for general adult medical examination without abnormal findings: Secondary | ICD-10-CM | POA: Diagnosis not present

## 2024-06-05 DIAGNOSIS — E785 Hyperlipidemia, unspecified: Secondary | ICD-10-CM

## 2024-06-05 LAB — MICROALBUMIN / CREATININE URINE RATIO
Creatinine,U: 61.5 mg/dL
Microalb Creat Ratio: 14.2 mg/g (ref 0.0–30.0)
Microalb, Ur: 0.9 mg/dL (ref 0.0–1.9)

## 2024-06-05 LAB — LIPID PANEL
Cholesterol: 157 mg/dL (ref 0–200)
HDL: 56.2 mg/dL (ref >39.00)
LDL Cholesterol: 74 mg/dL (ref 0–99)
NonHDL: 101.12
Total CHOL/HDL Ratio: 3
Triglycerides: 135 mg/dL (ref 0.0–149.0)
VLDL: 27 mg/dL (ref 0.0–40.0)

## 2024-06-05 LAB — HEMOGLOBIN A1C: Hgb A1c MFr Bld: 6.9 % — ABNORMAL HIGH (ref 4.6–6.5)

## 2024-06-05 NOTE — Telephone Encounter (Signed)
 It sounded like he had stopped but said he read somewhere that he could take 3-4 asa daily.  I advised against it due to potential for GI bleeding etc.... Pt then said advil had helped better when he tried it most recently and I again advised that continued daily use of any NSAID would increase his risk for potential GI bleeds. He voiced understanding and said he would discuss with you at upcoming OV.

## 2024-06-05 NOTE — Progress Notes (Signed)
 Subjective:   Gregory Tate is a 73 y.o. who presents for a Medicare Wellness preventive visit.  As a reminder, Annual Wellness Visits don't include a physical exam, and some assessments may be limited, especially if this visit is performed virtually. We may recommend an in-person follow-up visit with your provider if needed.  Visit Complete: In person  Persons Participating in Visit: Patient.  AWV Questionnaire: Yes: Patient Medicare AWV questionnaire was completed by the patient on 05/30/24; I have confirmed that all information answered by patient is correct and no changes since this date.  Cardiac Risk Factors include: dyslipidemia;male gender;hypertension;Other (see comment), Risk factor comments: history of bladder cancer     Objective:    Today's Vitals   06/05/24 1057 06/05/24 1342  BP: (!) 141/93 (!) 155/81  Pulse: 91   Resp: 18   Temp: 98.2 F (36.8 C)   TempSrc: Oral   SpO2: 99%   Weight: 243 lb 3.2 oz (110.3 kg)   Height: 6' 1 (1.854 m)    Body mass index is 32.09 kg/m.     06/05/2024    1:47 PM 05/30/2023   10:31 AM 05/24/2022   10:05 AM 05/18/2021   11:39 AM 04/02/2020   10:11 AM 04/02/2019    9:18 AM 03/29/2018   10:13 AM  Advanced Directives  Does Patient Have a Medical Advance Directive? Yes Yes Yes Yes Yes Yes Yes   Type of Estate agent of [place];Living will Living will Healthcare Power of [place];Out of facility DNR (pink MOST or yellow form);Living will Healthcare Power of [place];Living will Healthcare Power of [place];Living will Healthcare Power of [place];Living will Healthcare Power of [place];Living will  Does patient want to make changes to medical advance directive? No - Patient declined No - Patient declined No - Patient declined  No - Patient declined No - Patient declined  No - Patient declined   Copy of Healthcare Power of Attorney in Chart? No - copy requested  No - copy requested No - copy requested No - copy  requested No - copy requested  No - copy requested      Data saved with a previous flowsheet row definition    Current Medications (verified) Outpatient Encounter Medications as of 06/05/2024  Medication Sig   aspirin 81 MG tablet Take 81 mg by mouth daily.     lisinopril -hydrochlorothiazide  (ZESTORETIC ) 20-25 MG tablet Take 1 tablet by mouth daily.   simvastatin  (ZOCOR ) 40 MG tablet TAKE 1 TABLET BY MOUTH EVERY DAY   [DISCONTINUED] Cholecalciferol (VITAMIN D3) 1000 UNITS tablet Take 1,000 Units by mouth daily.     [DISCONTINUED] cyclobenzaprine  (FLEXERIL ) 5 MG tablet Take 1 tablet (5 mg total) by mouth at bedtime.   [DISCONTINUED] doxycycline  (VIBRAMYCIN ) 100 MG capsule Take 1 capsule (100 mg total) by mouth 2 (two) times daily.   [DISCONTINUED] famciclovir  (FAMVIR ) 500 MG tablet Take 1 tablet (500 mg total) by mouth 3 (three) times daily.   [DISCONTINUED] lisinopril -hydrochlorothiazide  (ZESTORETIC ) 20-25 MG tablet Take 1 tablet by mouth daily.   [DISCONTINUED] NON FORMULARY Cherry Extract as needed for Gout   No facility-administered encounter medications on file as of 06/05/2024.    Allergies (verified) Patient has no known allergies.   History: Past Medical History:  Diagnosis Date   Chicken pox    Dermatitis 01/28/2013   Fatty liver    mild   History of bladder cancer 2002   history of gross hematuria  secondary ro invasive bladder cancer - transitional  cell carcinoma of bladder stage T3 NO MX   Hyperlipidemia    Hypertension    Mumps    Shingles 08/05/2017   Past Surgical History:  Procedure Laterality Date   COLONOSCOPY  2007   TONSILLECTOMY     URINARY DIVERSION  2002   radical cystoprostatecomy and urinary diversion    Family History  Problem Relation Age of Onset   Hypertension Mother        Living   Coronary artery disease Father 5       Deceased   Hypertension Father    Alzheimer's disease Father    Diabetes Brother        borderline #1   Hypertension  Brother        #2   Heart disease Brother        #2   Healthy Son        x1   Heart attack Maternal Grandfather    Stroke Paternal Grandfather    Arthritis Other    Social History   Socioeconomic History   Marital status: Married    Spouse name: Not on file   Number of children: Not on file   Years of education: Not on file   Highest education level: Bachelor's degree (e.g., BA, AB, BS)  Occupational History   Not on file  Tobacco Use   Smoking status: Never   Smokeless tobacco: Never  Substance and Sexual Activity   Alcohol use: Yes    Alcohol/week: 3.0 standard drinks of alcohol    Types: 3 Cans of beer per week   Drug use: No   Sexual activity: Not Currently  Other Topics Concern   Not on file  Social History Narrative   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   Social Drivers of Corporate investment banker Strain: Low Risk  (05/30/2024)   Overall Financial Resource Strain (CARDIA)    Difficulty of Paying Living Expenses: Not hard at all  Food Insecurity: No Food Insecurity (05/30/2024)   Hunger Vital Sign    Worried About Running Out of Food in the Last Year: Never true    Ran Out of Food in the Last Year: Never true  Transportation Needs: No Transportation Needs (05/30/2024)   PRAPARE - Administrator, Civil Service (Medical): No    Lack of Transportation (Non-Medical): No  Physical Activity: Sufficiently Active (05/30/2024)   Exercise Vital Sign    Days of Exercise per Week: 7 days    Minutes of Exercise per Session: 40 min  Stress: No Stress Concern Present (06/05/2024)   [employer] of Occupational Health - Occupational Stress Questionnaire    Feeling of Stress: Only a little  Social Connections: Moderately Isolated (06/05/2024)   Social Connection and Isolation Panel     Frequency of Communication with Friends and Family: More than three times a week    Frequency of Social Gatherings with Friends and Family: Once a week    Attends Religious Services: Never    Database administrator or Organizations: No    Attends Banker Meetings: Never    Marital Status: Married    Tobacco Counseling Counseling given: Not Answered    Clinical Intake:  Consulting civil engineer  completed: Yes  Pain : No/denies pain     BMI - recorded: 32.09 Nutritional Status: BMI > 30  Obese Nutritional Risks: None Diabetes: Yes CBG done?: No  Lab Results  Component Value Date   HGBA1C 6.7 (H) 06/19/2023   HGBA1C 6.2 05/20/2021   HGBA1C 6.3 04/06/2020     How often do you need to have someone help you when you read instructions, pamphlets, or other written materials from your doctor or pharmacy?: 1 - Never What is the last grade level you completed in school?: college  Interpreter Needed?: No  Information entered by :: Lolita Libra, CMA   Activities of Daily Living     05/30/2024   11:41 AM  In your present state of health, do you have any difficulty performing the following activities:  Hearing? 0  Vision? 0  Difficulty concentrating or making decisions? 0  Walking or climbing stairs? 0  Dressing or bathing? 0  Doing errands, shopping? 0  Preparing Food and eating ? N  Using the Toilet? N  In the past six months, have you accidently leaked urine? Y  Comment due to history of bladder cancer  Do you have problems with loss of bowel control? N  Managing your Medications? N  Managing your Finances? N  Housekeeping or managing your Housekeeping? N    Patient Care Team: Saguier, Edward, PA-C as PCP - General (Internal Medicine) Matilda Senior, MD as Consulting Physician (Urology) Natha Reusing, MD as Referring Physician (Dermatology) [facility]  I have updated your Care Teams any recent Medical Services you may have  received from other providers in the past year.     Assessment:   This is a routine wellness examination for Rakeem.  Hearing/Vision screen Hearing Screening - Comments:: Denies hearing difficulties.  Vision Screening - Comments:: Lasik in 2002, wears otc readers. Eye exam up to date with [facility] Dr Dyann   Goals Addressed   None    Depression Screen     06/05/2024   11:15 AM 06/12/2023    1:28 PM 05/30/2023   10:34 AM 05/18/2021   11:42 AM 04/02/2020   10:14 AM 04/02/2019    9:20 AM 03/29/2018   10:14 AM  PHQ 2/9 Scores  PHQ - 2 Score 0 0 0 0 0 0 0  PHQ- 9 Score 3          Fall Risk     05/30/2024   11:41 AM 06/12/2023    1:28 PM 05/23/2023    8:59 AM 05/24/2022   10:06 AM 05/18/2021   11:42 AM  Fall Risk   Falls in the past year? 1 0 0 0 0  Number falls in past yr: 0 0 0 0 0  Injury with Fall? 0 0 0 0 0  Risk for fall due to :   No Fall Risks History of fall(s)   Follow up  Falls evaluation completed Falls evaluation completed Falls evaluation completed  Falls prevention discussed      Data saved with a previous flowsheet row definition    MEDICARE RISK AT HOME:  Medicare Risk at Home Any stairs in or around the home?: (Patient-Rptd) Yes If so, are there any without handrails?: (Patient-Rptd) No Home free of loose throw rugs in walkways, pet beds, electrical cords, etc?: (Patient-Rptd) Yes Adequate lighting in your home to reduce risk of falls?: (Patient-Rptd) Yes Life alert?: (Patient-Rptd) No Use of a cane, walker or w/c?: (Patient-Rptd) No Grab bars in  the bathroom?: (Patient-Rptd) No Shower chair or bench in shower?: (Patient-Rptd) No Elevated toilet seat or a handicapped toilet?: (Patient-Rptd) Yes  TIMED UP AND GO:  Was the test performed?  Yes  Length of time to ambulate 10 feet: 5 sec Gait slow and steady without use of assistive device  Cognitive Function: 6CIT completed        06/05/2024   11:29 AM 05/30/2023   10:52 AM 05/24/2022   10:16 AM   6CIT Screen  What Year? 0 points 0 points 0 points  What month? 0 points 0 points 0 points  What time? 0 points 0 points 0 points  Count back from 20 0 points 0 points 0 points  Months in reverse 0 points 0 points 0 points  Repeat phrase 0 points 0 points 2 points  Total Score 0 points 0 points 2 points    Immunizations Immunization History  Administered Date(s) Administered   Influenza Whole 11/30/2007, 08/28/2009   Influenza-Unspecified 04/05/2015   PFIZER(Purple Top)SARS-COV-2 Vaccination 01/02/2020, 01/30/2020, 12/13/2020, 02/01/2021   Pneumococcal Conjugate-13 02/24/2015   Pneumococcal Polysaccharide-23 01/02/2017   Td 05/22/2006   Tdap 01/02/2017    Screening Tests Health Maintenance  Topic Date Due   Diabetic kidney evaluation - Urine ACR  Never done   COVID-19 Vaccine (5 - 2024-25 season) 08/06/2023   Medicare Annual Wellness (AWV)  05/29/2024   Diabetic kidney evaluation - eGFR measurement  06/11/2024   Zoster Vaccines- Shingrix (1 of 2) 09/05/2024 (Originally 01/13/2000)   INFLUENZA VACCINE  07/05/2024   DTaP/Tdap/Td (3 - Td or Tdap) 01/02/2027   Colonoscopy  01/17/2027   Pneumococcal Vaccine: 50+ Years  Completed   Hepatitis C Screening  Completed   Hepatitis B Vaccines  Aged Out   HPV VACCINES  Aged Out   Meningococcal B Vaccine  Aged Out    Health Maintenance  Health Maintenance Due  Topic Date Due   Diabetic kidney evaluation - Urine ACR  Never done   COVID-19 Vaccine (5 - 2024-25 season) 08/06/2023   Medicare Annual Wellness (AWV)  05/29/2024   Diabetic kidney evaluation - eGFR measurement  06/11/2024   Health Maintenance Items Addressed: Will get COVID vaccine at pharmacy, declines shingles vaccine. Will complete urine MALB today.  Additional Screening:  Vision Screening: Recommended annual ophthalmology exams for early detection of glaucoma and other disorders of the eye. Would you like a referral to an eye doctor? No    Dental Screening:  Recommended annual dental exams for proper oral hygiene  Community Resource Referral / Chronic Care Management: CRR required this visit?  No   CCM required this visit?  No   Plan:    I have personally reviewed and noted the following in the patient's chart:   Medical and social history Use of alcohol, tobacco or illicit drugs  Current medications and supplements including opioid prescriptions. Patient is not currently taking opioid prescriptions. Functional ability and status Nutritional status Physical activity Advanced directives List of other physicians Hospitalizations, surgeries, and ER visits in previous 12 months Vitals Screenings to include cognitive, depression, and falls Referrals and appointments  In addition, I have reviewed and discussed with patient certain preventive protocols, quality metrics, and best practice recommendations. A written personalized care plan for preventive services as well as general preventive health recommendations were provided to patient.   Lolita Libra, CMA   06/05/2024   After Visit Summary: (In Person-Printed) AVS printed and given to the patient  Notes: see phone note

## 2024-06-05 NOTE — Patient Instructions (Addendum)
 Gregory Tate , Thank you for taking time out of your busy schedule to complete your Annual Wellness Visit with me. I enjoyed our conversation and look forward to speaking with you again next year. I, as well as your care team,  appreciate your ongoing commitment to your health goals. Please review the following plan we discussed and let me know if I can assist you in the future. Your Game plan/ To Do List     Follow up Visits: Next Medicare AWV with our clinical staff:   06/10/25 11am  Next Office Visit with your provider: 7/11/1pm  Clinician Recommendations:  Aim for 30 minutes of exercise or brisk walking, 6-8 glasses of water, and 5 servings of fruits and vegetables each day.  You will need to get the following vaccines at your local pharmacy: COVID,   This is a list of the screening recommended for you and due dates:  Health Maintenance  Topic Date Due   Yearly kidney health urinalysis for diabetes  Never done   COVID-19 Vaccine (5 - 2024-25 season) 08/06/2023   Medicare Annual Wellness Visit  05/29/2024   Yearly kidney function blood test for diabetes  06/11/2024   Zoster (Shingles) Vaccine (1 of 2) 09/05/2024*   Flu Shot  07/05/2024   DTaP/Tdap/Td vaccine (3 - Td or Tdap) 01/02/2027   Colon Cancer Screening  01/17/2027   Pneumococcal Vaccine for age over 72  Completed   Hepatitis C Screening  Completed   Hepatitis B Vaccine  Aged Out   HPV Vaccine  Aged Out   Meningitis B Vaccine  Aged Out  *Topic was postponed. The date shown is not the original due date.    Advanced directives: (Copy Requested) Please bring a copy of your health care power of attorney and living will to the office to be added to your chart at your convenience. You can mail to [facility] [address] [place], [place] [address] or email to [email or web address] Advance Care Planning is important because it:  [x]  Makes sure you receive the medical care that is consistent with your values,  goals, and preferences  [x]  It provides guidance to your family and loved ones and reduces their decisional burden about whether or not they are making the right decisions based on your wishes.  Follow the link provided in your after visit summary or read over the paperwork we have mailed to you to help you started getting your Advance Directives in place. If you need assistance in completing these, please reach out to us  so that we can help you!  See attachments for Preventive Care and Fall Prevention Tips.

## 2024-06-05 NOTE — Telephone Encounter (Addendum)
 Pt had AWV today.  I scheduled him for his f/u with on 7/11 at 1pm. Routine f/u and has bilateral hip pain, CT at [facility] in 09/2023. Had been taking 3-4 aspirin a day. Found that Advil works best, has been applying blue Emu but only gives relief for a couple of hours. Will want to know what he can do or take for the pain. Will discuss at upcoming OV.  BP was also elevated today. 141/93,  155/81.

## 2024-06-06 ENCOUNTER — Ambulatory Visit: Payer: Self-pay | Admitting: Medical

## 2024-06-06 LAB — COMPLETE METABOLIC PANEL WITHOUT GFR
AG Ratio: 2.2 (calc) (ref 1.0–2.5)
ALT: 20 U/L (ref 9–46)
AST: 17 U/L (ref 10–35)
Albumin: 4.3 g/dL (ref 3.6–5.1)
Alkaline phosphatase (APISO): 39 U/L (ref 35–144)
BUN/Creatinine Ratio: 25 (calc) — ABNORMAL HIGH (ref 6–22)
BUN: 26 mg/dL — ABNORMAL HIGH (ref 7–25)
CO2: 26 mmol/L (ref 20–32)
Calcium: 8.9 mg/dL (ref 8.6–10.3)
Chloride: 101 mmol/L (ref 98–110)
Creat: 1.02 mg/dL (ref 0.70–1.28)
Globulin: 2 g/dL (ref 1.9–3.7)
Glucose, Bld: 169 mg/dL — ABNORMAL HIGH (ref 65–99)
Potassium: 4.3 mmol/L (ref 3.5–5.3)
Sodium: 136 mmol/L (ref 135–146)
Total Bilirubin: 0.7 mg/dL (ref 0.2–1.2)
Total Protein: 6.3 g/dL (ref 6.1–8.1)

## 2024-06-14 ENCOUNTER — Encounter: Payer: Self-pay | Admitting: Medical

## 2024-06-14 ENCOUNTER — Ambulatory Visit: Admitting: Medical

## 2024-06-14 VITALS — BP 140/80 | HR 60 | Resp 18 | Ht 73.0 in | Wt 246.0 lb

## 2024-06-14 DIAGNOSIS — E785 Hyperlipidemia, unspecified: Secondary | ICD-10-CM | POA: Diagnosis not present

## 2024-06-14 DIAGNOSIS — I10 Essential (primary) hypertension: Secondary | ICD-10-CM

## 2024-06-14 DIAGNOSIS — E119 Type 2 diabetes mellitus without complications: Secondary | ICD-10-CM

## 2024-06-14 DIAGNOSIS — M2550 Pain in unspecified joint: Secondary | ICD-10-CM

## 2024-06-14 NOTE — Patient Instructions (Addendum)
 Type 2 Diabetes Mellitus A1c at 6.9%.. Discussed metformin initiation if A1c exceeds 7%.(want a1c closer to 6.5. - Monitor A1c and blood glucose. - Consider metformin 500 mg daily if A1c > 7%. - Encourage diet and exercise modifications.  Hypertension Blood pressure at 140/80 mmHg, slightly above target for diabetics. On lisinopril  HCTZ 20/25 mg daily. - Monitor blood pressure at home. - Adjust medication if blood pressure remains elevated.  Chronic Joint Pain Pain in hip, back, shoulder, knee due to past injuries.  - Discouraged aspirin use for pain due to stomach ulcer concern if overused.  ibuprofen, Tylenol as needed. - Use Tylenol with low-dose ibuprofen for better control. - Limit aspirin to prevent ulcers.  General Health Maintenance Active lifestyle with 10,000 steps daily and resistance exercises. Emphasized regular exercise and monitoring health parameters. - Continue walking and resistance exercises. - Schedule regular check-ups and lab tests.  High cholesterol -controlled with simvastatin   Follow-up Plan to monitor A1c and blood pressure closely. - Schedule A1c and metabolic panel in 90 days.(fax results to our office - Send blood pressure updates in 3-4 days.  Follow up at least yearly but sooner if needed.........................................................................................................Gregory Tate
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
## 2024-07-04 ENCOUNTER — Encounter: Payer: Self-pay | Admitting: Medical

## 2024-07-05 MED ORDER — SIMVASTATIN 40 MG PO TABS: 40.0000 mg | ORAL_TABLET | Freq: Every day | ORAL | 1 refills | Status: DC

## 2024-07-05 MED ORDER — LISINOPRIL-HYDROCHLOROTHIAZIDE 20-25 MG PO TABS
1.0000 | ORAL_TABLET | Freq: Every day | ORAL | 0 refills | Status: DC
Start: 2024-07-05 — End: 2024-09-19

## 2024-07-05 NOTE — Addendum Note (Signed)
 Addended by: DORINA DALLAS HERO on: 07/05/2024 04:46 PM   Modules accepted: Orders

## 2024-09-19 ENCOUNTER — Other Ambulatory Visit: Payer: Self-pay | Admitting: Medical

## 2024-10-02 DIAGNOSIS — M545 Low back pain, unspecified: Secondary | ICD-10-CM | POA: Diagnosis not present

## 2024-10-02 DIAGNOSIS — E669 Obesity, unspecified: Secondary | ICD-10-CM | POA: Diagnosis not present

## 2024-10-02 DIAGNOSIS — E785 Hyperlipidemia, unspecified: Secondary | ICD-10-CM | POA: Diagnosis not present

## 2024-10-02 DIAGNOSIS — M1990 Unspecified osteoarthritis, unspecified site: Secondary | ICD-10-CM | POA: Diagnosis not present

## 2024-10-02 DIAGNOSIS — I10 Essential (primary) hypertension: Secondary | ICD-10-CM | POA: Diagnosis not present

## 2024-10-02 DIAGNOSIS — M62838 Other muscle spasm: Secondary | ICD-10-CM | POA: Diagnosis not present

## 2024-10-02 DIAGNOSIS — N529 Male erectile dysfunction, unspecified: Secondary | ICD-10-CM | POA: Diagnosis not present

## 2024-10-02 DIAGNOSIS — E1136 Type 2 diabetes mellitus with diabetic cataract: Secondary | ICD-10-CM | POA: Diagnosis not present

## 2024-10-02 DIAGNOSIS — Z7982 Long term (current) use of aspirin: Secondary | ICD-10-CM | POA: Diagnosis not present

## 2024-10-15 ENCOUNTER — Encounter: Payer: Self-pay | Admitting: Urology

## 2024-10-15 NOTE — Telephone Encounter (Signed)
 FYI

## 2024-10-30 DIAGNOSIS — G8929 Other chronic pain: Secondary | ICD-10-CM | POA: Diagnosis not present

## 2024-10-30 DIAGNOSIS — R030 Elevated blood-pressure reading, without diagnosis of hypertension: Secondary | ICD-10-CM | POA: Diagnosis not present

## 2024-10-30 DIAGNOSIS — M5442 Lumbago with sciatica, left side: Secondary | ICD-10-CM | POA: Diagnosis not present

## 2024-11-18 ENCOUNTER — Encounter: Payer: Self-pay | Admitting: Medical

## 2024-11-19 ENCOUNTER — Ambulatory Visit: Payer: Self-pay

## 2024-11-19 NOTE — Telephone Encounter (Signed)
 FYI Only or Action Required?: Action required by provider: Lab test request.  Patient was last seen in primary care on 06/14/2024 by Dorina Loving, PA-C.  Called Nurse Triage reporting Gout.  Symptoms began several days ago.  Interventions attempted: Nothing.  Symptoms are: stable.  Triage Disposition: See PCP Within 2 Weeks  Patient/caregiver understands and will follow disposition?: Yes  Reason for Disposition  [1] MILD pain (e.g., does not interfere with normal activities) AND [2] present > 7 days  Answer Assessment - Initial Assessment Questions Patient reports stopping gabapentin and feeling better. Patient mentioned he is having an MRI on 12/10/24 for hip issues.  Patient states he has lab orders for A1c and CMP to be done locally, he is looking for Uric acid lab order to get done locally at [address] [facility] [address] walk in clinic # [phone]. He lives in [place] now and is not looking to drive for an appointment and lab work. Please advise   1. ONSET: When did the pain start?      Flare ups from time to time  2. LOCATION: Where is the pain located?      L big toe  3. CAUSE: What do you think is causing the foot pain?     Gout flare up  Protocols used: Foot Pain-A-AH

## 2024-11-19 NOTE — Telephone Encounter (Signed)
 Error CRM sent, chart not reviewed, see mychart message from 11/18/24- Pt was informed he would need visit first. I have asked E2C2 to call Pt back to schedule.

## 2024-11-19 NOTE — Telephone Encounter (Signed)
 This RN made first attempt to contact pt with no answer. A voicemail was left with call back number provided.     Copied from CRM #[id number]. Topic: Clinical - Red Word Triage >> Nov 18, 2024  2:12 PM Thersia BROCKS wrote: [facility] that prompted transfer to Nurse Triage: Patient called in stated has been put on medication, aggravated his gout, stated he needs something for the pain for the gout

## 2024-11-20 ENCOUNTER — Other Ambulatory Visit: Payer: Self-pay

## 2024-11-20 ENCOUNTER — Ambulatory Visit: Payer: Self-pay | Admitting: Medical

## 2024-11-20 ENCOUNTER — Other Ambulatory Visit

## 2024-11-20 DIAGNOSIS — E119 Type 2 diabetes mellitus without complications: Secondary | ICD-10-CM | POA: Diagnosis not present

## 2024-11-20 DIAGNOSIS — R252 Cramp and spasm: Secondary | ICD-10-CM | POA: Diagnosis not present

## 2024-11-20 DIAGNOSIS — I10 Essential (primary) hypertension: Secondary | ICD-10-CM | POA: Diagnosis not present

## 2024-11-20 DIAGNOSIS — M2550 Pain in unspecified joint: Secondary | ICD-10-CM

## 2024-11-20 LAB — COMPREHENSIVE METABOLIC PANEL WITH GFR
ALT: 22 U/L (ref 3–53)
AST: 20 U/L (ref 5–37)
Albumin: 4.5 g/dL (ref 3.5–5.2)
Alkaline Phosphatase: 44 U/L (ref 39–117)
BUN: 25 mg/dL — ABNORMAL HIGH (ref 6–23)
CO2: 31 meq/L (ref 19–32)
Calcium: 9.2 mg/dL (ref 8.4–10.5)
Chloride: 99 meq/L (ref 96–112)
Creatinine, Ser: 0.9 mg/dL (ref 0.40–1.50)
GFR: 83.93 mL/min (ref >60.00)
Glucose, Bld: 114 mg/dL — ABNORMAL HIGH (ref 70–99)
Potassium: 4.7 meq/L (ref 3.5–5.1)
Sodium: 137 meq/L (ref 135–145)
Total Bilirubin: 0.8 mg/dL (ref 0.2–1.2)
Total Protein: 6.8 g/dL (ref 6.0–8.3)

## 2024-11-20 LAB — URIC ACID: Uric Acid, Serum: 7.3 mg/dL (ref 4.0–7.8)

## 2024-11-20 LAB — HEMOGLOBIN A1C: Hgb A1c MFr Bld: 6.8 % — ABNORMAL HIGH (ref 4.6–6.5)

## 2024-11-20 NOTE — Addendum Note (Signed)
 Addended by: ESTELLE GILLIS D on: 11/20/2024 12:54 PM   Modules accepted: Orders

## 2024-11-20 NOTE — Telephone Encounter (Signed)
 Appt scheduled

## 2024-11-20 NOTE — Addendum Note (Signed)
 Addended by: ESTELLE GILLIS D on: 11/20/2024 12:57 PM   Modules accepted: Orders

## 2024-11-22 ENCOUNTER — Ambulatory Visit: Admitting: Medical

## 2024-11-22 VITALS — BP 120/74 | HR 96 | Temp 97.8°F | Resp 15 | Ht 73.0 in | Wt 246.4 lb

## 2024-11-22 DIAGNOSIS — R0989 Other specified symptoms and signs involving the circulatory and respiratory systems: Secondary | ICD-10-CM

## 2024-11-22 DIAGNOSIS — R252 Cramp and spasm: Secondary | ICD-10-CM | POA: Diagnosis not present

## 2024-11-22 DIAGNOSIS — M79675 Pain in left toe(s): Secondary | ICD-10-CM | POA: Diagnosis not present

## 2024-11-22 DIAGNOSIS — M79672 Pain in left foot: Secondary | ICD-10-CM | POA: Diagnosis not present

## 2024-11-22 DIAGNOSIS — E119 Type 2 diabetes mellitus without complications: Secondary | ICD-10-CM

## 2024-11-22 DIAGNOSIS — M25552 Pain in left hip: Secondary | ICD-10-CM

## 2024-11-22 LAB — COMPREHENSIVE METABOLIC PANEL WITH GFR
ALT: 21 U/L (ref 3–53)
AST: 20 U/L (ref 5–37)
Albumin: 4.5 g/dL (ref 3.5–5.2)
Alkaline Phosphatase: 42 U/L (ref 39–117)
BUN: 30 mg/dL — ABNORMAL HIGH (ref 6–23)
CO2: 30 meq/L (ref 19–32)
Calcium: 9.3 mg/dL (ref 8.4–10.5)
Chloride: 100 meq/L (ref 96–112)
Creatinine, Ser: 0.86 mg/dL (ref 0.40–1.50)
GFR: 85.09 mL/min
Glucose, Bld: 160 mg/dL — ABNORMAL HIGH (ref 70–99)
Potassium: 4.6 meq/L (ref 3.5–5.1)
Sodium: 137 meq/L (ref 135–145)
Total Bilirubin: 0.8 mg/dL (ref 0.2–1.2)
Total Protein: 6.8 g/dL (ref 6.0–8.3)

## 2024-11-22 LAB — MAGNESIUM: Magnesium: 2.1 mg/dL (ref 1.5–2.5)

## 2024-11-22 MED ORDER — TRAMADOL HCL 50 MG PO TABS: 50.0000 mg | ORAL_TABLET | Freq: Three times a day (TID) | ORAL | 0 refills | Status: AC | PRN

## 2024-11-22 NOTE — Patient Instructions (Signed)
 Gout flare of the left foot and big toe(calf cramps and decreased cap refills) Gout flare in the left foot and big toe, initially thought exacerbated by gabapentin. Symptoms improved post-discontinuation, but nocturnal pain persists. Uric acid level at 7.3. Medrol considered but concerns about hyperglycemia due to diabetes. - Prescribed tramadol  for nighttime pain, 20 tablets, (max every 8 hours as needed.) - Recommended low dose ibuprofen (200-400 mg) every 8 hours during the day if pain recurs. - Ordered metabolic panel to check potassium and magnesium levels. - Ordered ankle-brachial index (ABI) to evaluate circulation in the feet.  Chronic left hip pain with spasms Chronic left hip pain with spasms, exacerbated by uneven surfaces, relieved by physical therapy. Gabapentin effective but caused gout flare. MRI scheduled for further evaluation. - Continue physical therapy exercises. - Follow up with orthopedist on January 13th after MRI results.  Type 2 diabetes mellitus A1c of 6.8. Discussed potential need for medication if lifestyle modifications fail. He prefers to avoid medication. Discussed steroid impact on blood sugar. - Recommended obtaining a glucometer to monitor blood sugar levels. - Advised checking blood sugar levels once or twice daily, particularly after meals. - Discussed potential use of metformin if lifestyle modifications are insufficient.  Lower extremity cramps and spasms Intermittent lower extremity cramps and spasms, particularly nocturnal. Possible metabolic or circulatory etiology. - Ordered metabolic panel to check potassium and magnesium levels. - Ordered ankle-brachial index (ABI) to evaluate circulation in the feet.  Peripheral arterial disease Possible peripheral arterial disease due to slow capillary refill and color changes in toes. Diminished pulses on the left.(cramps both sides) - Ordered ankle-brachial index (ABI) to evaluate circulation in the  feet.  Follow up date to be determined after lab review, abi,  mri hip and depending on sugar levels.

## 2024-11-22 NOTE — Progress Notes (Signed)
 "  Subjective:    Patient ID: Gregory Tate, male    DOB: 1951-03-19, 73 y.o.   MRN: [id number]  HPI  Gregory Tate Gregory Tate is a 73 year old male with gout who presents with foot and hip pain.  He has significant pain in the foot, mainly in the left side great toe, which he associates with starting gabapentin for nerve pain rx'd by ortho. The foot became acutely swollen and very painful for about three weeks, consistent with a gout flare, and he stopped gabapentin four days ago. Since stopping it, pain has improved to a moderate level but is still worse at night. He notes color changes in the foot, turning dark pinkish red. Some cramps in both calfs.  He developed severe hip pain with spasms starting in October 2024, which led to an ER visit where a CT scan was done. An MRI is scheduled for January 6. He is in physical therapy and does home exercises. Hip pain worsens with walking on uneven ground but is better on flat surfaces. Pain is more pronounced at night and disturbs sleep. He has early morning leg cramps around 5 AM, often described as a Charlie horse, with pain that can radiate to the top of the hip. He is taking a muscle relaxant for hip spasms and continues PT.  His most recent A1c is 6.8. He has a family history of diabetes in his brother. He is not currently checking blood sugars at home.  He denies calf cramping with walking. Cramping occurs mainly at night around 5 AM. Foot pain is mainly nocturnal and accompanied by color changes when hip pressure is applied.  Review of Systems See hpi    Objective:   Physical Exam  General Mental Status- Alert. General Appearance- Not in acute distress.   Skin General: Color- Normal Color. Moisture- Normal Moisture.  Neck Carotid Arteries- Normal color. Moisture- Normal Moisture. No carotid bruits. No JVD.  Chest and Lung Exam Auscultation: Breath Sounds:-Normal.  Cardiovascular Auscultation:Rythm- Regular. Murmurs &  Other Heart Sounds:Auscultation of the heart reveals- No Murmurs.  Abdomen Inspection:-Inspeection Normal. Palpation/Percussion:Note:No mass. Palpation and Percussion of the abdomen reveal- Non Tender, Non Distended + BS, no rebound or guarding.   Neurologic Cranial Nerve exam:- CN III-XII intact(No nystagmus), symmetric smile. Strength:- 5/5 equal and symmetric strength both upper and lower extremities.    Lower ext- calfs symmetic, negative homans signs. No pedal edema. Left foot- decreased pulse mild and decreaed capillary refill. No warmth. Foot and toe no obvious tenderness presently.    Assessment & Plan:  Gout flare of the left foot and big toe(calf cramps and decreased cap refills) Gout flare in the left foot and big toe, initially thought exacerbated by gabapentin. Symptoms improved post-discontinuation, but nocturnal pain persists. Uric acid level at 7.3. Medrol considered but concerns about hyperglycemia due to diabetes. - Prescribed tramadol  for nighttime pain, 20 tablets, (max every 8 hours as needed.) - Recommended low dose ibuprofen (200-400 mg) every 8 hours during the day if pain recurs. - Ordered metabolic panel to check potassium and magnesium levels. - Ordered ankle-brachial index (ABI) to evaluate circulation in the feet.  Chronic left hip pain with spasms Chronic left hip pain with spasms, exacerbated by uneven surfaces, relieved by physical therapy. Gabapentin effective but caused gout flare. MRI scheduled for further evaluation. - Continue physical therapy exercises. - Follow up with orthopedist on January 13th after MRI results.  Type 2 diabetes mellitus A1c of 6.8. Discussed  potential need for medication if lifestyle modifications fail. He prefers to avoid medication. Discussed steroid impact on blood sugar. - Recommended obtaining a glucometer to monitor blood sugar levels. - Advised checking blood sugar levels once or twice daily, particularly after meals. -  Discussed potential use of metformin if lifestyle modifications are insufficient.  Lower extremity cramps and spasms Intermittent lower extremity cramps and spasms, particularly nocturnal. Possible metabolic or circulatory etiology. - Ordered metabolic panel to check potassium and magnesium levels. - Ordered ankle-brachial index (ABI) to evaluate circulation in the feet.  Peripheral arterial disease Possible peripheral arterial disease due to slow capillary refill and color changes in toes. Diminished pulses on the left.(cramps both sides) - Ordered ankle-brachial index (ABI) to evaluate circulation in the feet.  Follow up date to be determined after lab review, abi,  mri hip and depending on sugar levels.   I personally spent a total of 42 minutes in the care of the patient today including performing a medically appropriate exam/evaluation, counseling and educating, placing orders, and documenting clinical information in the EHR.   "

## 2024-11-23 ENCOUNTER — Ambulatory Visit: Payer: Self-pay | Admitting: Medical

## 2024-11-26 ENCOUNTER — Encounter: Payer: Self-pay | Admitting: Medical

## 2024-11-26 NOTE — Addendum Note (Signed)
 Addended by: Sarvesh Meddaugh D on: 11/26/2024 04:56 PM   Modules accepted: Orders

## 2024-11-29 ENCOUNTER — Ambulatory Visit (HOSPITAL_COMMUNITY)
Admission: RE | Admit: 2024-11-29 | Discharge: 2024-11-29 | Disposition: A | Discharge status: Home / Self Care | Source: Ambulatory Visit | Attending: Medical | Admitting: Medical

## 2024-11-29 DIAGNOSIS — R252 Cramp and spasm: Secondary | ICD-10-CM | POA: Diagnosis not present

## 2024-11-29 DIAGNOSIS — R0989 Other specified symptoms and signs involving the circulatory and respiratory systems: Secondary | ICD-10-CM | POA: Diagnosis present

## 2024-12-01 LAB — VAS US ABI WITH/WO TBI
Left ABI: 1.13
Right ABI: 1.03

## 2024-12-02 ENCOUNTER — Other Ambulatory Visit: Payer: Self-pay | Admitting: Medical

## 2024-12-03 NOTE — Progress Notes (Signed)
 DAILY TREATMENT/ASSESSMENT NOTE  Patient Name:  Gregory Tate Date of Birth:  March 24, 1951 Today's Date:  December 03, 2024 Referring Provider: Daune PARAS. Waddell, MD Visit # : [id number] Encounter date: 12/03/2024  Certification Period: 11/13/24 to 02/11/25  Date for PT Re-Evaluation: 12/13/24   LB & L hip pain    ICD-10-CM  1. Left hip pain  M25.552  2. Altered gait  R26.9  3. Back pain  M54.9     Subjective   Pt reports he has some pain in the back and lowe leg. He notes he has had some decreased tension in the hip after last session and using his massage gun.   Objective and Treatment   Interventions  CPT INTERVENTION EXS/ACTIVITY DESCRIPTION  THERAPEUTIC EXERCISE (02889) Purpose: To increase, ROM, muscle strength, flexibility, and endurance - Physioball roll outsx10 each way - DKTC with feet on green swiss ball x 2 min - LTR x 2 min on green swiss ball - Hook fig 4 piriformis 30 sec hold x 3 - hook fig 4 ER stretch 30 sec hold x 3  Not performed: - Hooklying SKTC flossing x 10 B using towel to hold LE - supine hs stretch w/ strap 3 reps x 30 sec   NEUROMUSCULAR RE-EDUCATION (02887) Purpose: To improve motor recruitment, coordination, proprioception, balance and posture -  posterior pelvic tilt x 10 reps with manual facilitation - Standing physioball push down with core engagement - Pallof press 2x10 ea way - Farmers carry 5# KB 3 laps - Oblique crunch (limited ROM)   NP today: - slump slider x 10 reps - Hooklying TrA brace with march x 2 min - Hooklying TrA brace with hip add w/ purple ball x 1 min; hip abd to bridge x 10 reps   THERAPEUTIC ACTIVITY (02469) Purpose: To increase capacity for  []  lifting and carrying []  reaching []  pushing and pulling  []  squatting   []  transfers - child's pose x  2 min  Patient Education:  - reviewed and updated HEP - educated on importance of spinal mobility along with strengthening and flexibility  - reviewed and educated on TrA  activation in different positions - discussed self massage techniques with massage gun or theracane   MANUAL THERAPY (97140) Purpose: To improve  []  soft tissue mobility []  joint mobility []  decrease mm guarding []  decrease edema  NP: - STM and ART to hip abd/ER's  -manual spinal traction w/ swiss ball 2 sets x 2 min   Next treatment session: Progress core stabilization w/ BP cuff for feedback, strengthening & mobility exs as able.  Treatment Time & Charges  Today's Evaluation/Treatment: 1500 - 1540 Total Time: 40 min   Charges: Total Time Code Treatment Minutes:    PT Therapeutic Time Entry Therapeutic Exercise Time Entry: 8 Neuromuscular Re-education Time Entry: 10 Therapeutic Activity Time Entry: 5   Assessment  Pt continues to have difficulty performing pelvic tilts and TrA activation. Trialed standing ball push downs to work on core activation in standing, and pt able to tolerate this better than supine activation. Initiated pallof press and farmers carry with fair tolerance. Patient notes pain with exercise but reports it is within his range that he can push through. Standing oblique crunch performed with good tolerance. Patient presents with continued deficits in functional mobility, ROM, core stabilization & strength; therefore, continued skilled physical therapy is indicated. Continue to progress as able.  Goals: STG: 1. Pt will improve lumbar AROM to 70% flex and ext in 4  weeks 2. Pt will have TKE in 4 weeks to have reduced neural tension   LTG: 1. Pt will improve LE strength to 4+/5  2. Pt will improve hip mobility to 75% of norms  Plan  Continue per PT POC 2 times a week x 7 more weeks for LB & hip therapy.     Jeannine Ley,  12/03/2024 3:58 PM  [id number] [address] [place] [address] Dept: [phone] Dept Fax: [phone]

## 2024-12-03 NOTE — Progress Notes (Signed)
 Last read by Christopher LELON Mosetta Christopher Mosetta at 9:23AM on 12/02/2024.

## 2024-12-04 ENCOUNTER — Other Ambulatory Visit: Payer: Self-pay

## 2024-12-04 MED ORDER — ACCU-CHEK GUIDE TEST VI STRP: ORAL_STRIP | 12 refills | Status: AC

## 2024-12-04 MED ORDER — ACCU-CHEK SOFTCLIX LANCETS MISC: 12 refills | Status: AC

## 2024-12-04 MED ORDER — ACCU-CHEK GUIDE ME W/DEVICE KIT: PACK | 0 refills | Status: AC

## 2025-06-10 ENCOUNTER — Ambulatory Visit
